# Patient Record
Sex: Female | Born: 1970
Health system: Southern US, Community
[De-identification: ages and names within clinical notes are randomized; demographics above are authoritative.]

## PROBLEM LIST (undated history)

## (undated) ENCOUNTER — Emergency Department (HOSPITAL_BASED_OUTPATIENT_CLINIC_OR_DEPARTMENT_OTHER): Admission: EM | Payer: 59 | Source: Home / Self Care

## (undated) DIAGNOSIS — N979 Female infertility, unspecified: Secondary | ICD-10-CM

## (undated) DIAGNOSIS — A64 Unspecified sexually transmitted disease: Secondary | ICD-10-CM

## (undated) DIAGNOSIS — N809 Endometriosis, unspecified: Secondary | ICD-10-CM

## (undated) DIAGNOSIS — F32A Depression, unspecified: Secondary | ICD-10-CM

## (undated) DIAGNOSIS — N939 Abnormal uterine and vaginal bleeding, unspecified: Secondary | ICD-10-CM

## (undated) DIAGNOSIS — G43909 Migraine, unspecified, not intractable, without status migrainosus: Secondary | ICD-10-CM

## (undated) DIAGNOSIS — D219 Benign neoplasm of connective and other soft tissue, unspecified: Secondary | ICD-10-CM

## (undated) DIAGNOSIS — F329 Major depressive disorder, single episode, unspecified: Secondary | ICD-10-CM

## (undated) HISTORY — DX: Female infertility, unspecified: N97.9

## (undated) HISTORY — DX: Unspecified sexually transmitted disease: A64

## (undated) HISTORY — DX: Migraine, unspecified, not intractable, without status migrainosus: G43.909

## (undated) HISTORY — PX: DILATION AND CURETTAGE OF UTERUS: SHX78

## (undated) HISTORY — DX: Endometriosis, unspecified: N80.9

## (undated) HISTORY — DX: Depression, unspecified: F32.A

## (undated) HISTORY — DX: Major depressive disorder, single episode, unspecified: F32.9

## (undated) HISTORY — DX: Benign neoplasm of connective and other soft tissue, unspecified: D21.9

## (undated) HISTORY — DX: Abnormal uterine and vaginal bleeding, unspecified: N93.9

---

## 2009-03-12 ENCOUNTER — Emergency Department (HOSPITAL_COMMUNITY): Admission: EM | Admit: 2009-03-12 | Discharge: 2009-03-12 | Payer: Self-pay | Admitting: Family Medicine

## 2009-06-19 ENCOUNTER — Emergency Department (HOSPITAL_COMMUNITY): Admission: EM | Admit: 2009-06-19 | Discharge: 2009-06-19 | Payer: Self-pay | Admitting: Emergency Medicine

## 2009-07-20 ENCOUNTER — Emergency Department (HOSPITAL_COMMUNITY): Admission: EM | Admit: 2009-07-20 | Discharge: 2009-07-21 | Payer: Self-pay | Admitting: Emergency Medicine

## 2010-02-18 ENCOUNTER — Emergency Department (HOSPITAL_COMMUNITY)
Admission: EM | Admit: 2010-02-18 | Discharge: 2010-02-18 | Payer: Self-pay | Source: Home / Self Care | Admitting: Emergency Medicine

## 2010-02-28 LAB — URINALYSIS, ROUTINE W REFLEX MICROSCOPIC
Bilirubin Urine: NEGATIVE
Hgb urine dipstick: NEGATIVE
Ketones, ur: NEGATIVE mg/dL
Nitrite: NEGATIVE
Protein, ur: NEGATIVE mg/dL
Specific Gravity, Urine: 1.024 (ref 1.005–1.030)
Urine Glucose, Fasting: NEGATIVE mg/dL
Urobilinogen, UA: 0.2 mg/dL (ref 0.0–1.0)
pH: 5 (ref 5.0–8.0)

## 2010-02-28 LAB — POCT PREGNANCY, URINE: Preg Test, Ur: NEGATIVE

## 2010-02-28 LAB — WET PREP, GENITAL
Trich, Wet Prep: NONE SEEN
Yeast Wet Prep HPF POC: NONE SEEN

## 2010-02-28 LAB — GC/CHLAMYDIA PROBE AMP, GENITAL
Chlamydia, DNA Probe: NEGATIVE
GC Probe Amp, Genital: NEGATIVE

## 2010-05-02 LAB — CBC
HCT: 36.4 % (ref 36.0–46.0)
Hemoglobin: 12.4 g/dL (ref 12.0–15.0)
MCHC: 34 g/dL (ref 30.0–36.0)
MCV: 91.2 fL (ref 78.0–100.0)
Platelets: 258 10*3/uL (ref 150–400)
RBC: 3.99 MIL/uL (ref 3.87–5.11)
RDW: 15.3 % (ref 11.5–15.5)
WBC: 6.8 10*3/uL (ref 4.0–10.5)

## 2010-05-02 LAB — COMPREHENSIVE METABOLIC PANEL
ALT: 17 U/L (ref 0–35)
AST: 19 U/L (ref 0–37)
Albumin: 3.7 g/dL (ref 3.5–5.2)
Alkaline Phosphatase: 61 U/L (ref 39–117)
BUN: 14 mg/dL (ref 6–23)
CO2: 25 mEq/L (ref 19–32)
Calcium: 8.8 mg/dL (ref 8.4–10.5)
Chloride: 107 mEq/L (ref 96–112)
Creatinine, Ser: 1.08 mg/dL (ref 0.4–1.2)
GFR calc Af Amer: >60 mL/min (ref >60)
GFR calc non Af Amer: 56 mL/min — ABNORMAL LOW (ref >60)
Glucose, Bld: 81 mg/dL (ref 70–99)
Potassium: 4.2 mEq/L (ref 3.5–5.1)
Sodium: 136 mEq/L (ref 135–145)
Total Bilirubin: 0.5 mg/dL (ref 0.3–1.2)
Total Protein: 6.8 g/dL (ref 6.0–8.3)

## 2010-05-02 LAB — DIFFERENTIAL
Basophils Absolute: 0.1 10*3/uL (ref 0.0–0.1)
Basophils Relative: 1 % (ref 0–1)
Eosinophils Absolute: 0.1 10*3/uL (ref 0.0–0.7)
Eosinophils Relative: 1 % (ref 0–5)
Lymphocytes Relative: 32 % (ref 12–46)
Lymphs Abs: 2.2 10*3/uL (ref 0.7–4.0)
Monocytes Absolute: 0.7 10*3/uL (ref 0.1–1.0)
Monocytes Relative: 11 % (ref 3–12)
Neutro Abs: 3.7 10*3/uL (ref 1.7–7.7)
Neutrophils Relative %: 55 % (ref 43–77)

## 2010-05-03 LAB — GC/CHLAMYDIA PROBE AMP, GENITAL
Chlamydia, DNA Probe: POSITIVE — AB
GC Probe Amp, Genital: NEGATIVE

## 2010-05-03 LAB — RPR: RPR Ser Ql: NONREACTIVE

## 2010-05-03 LAB — WET PREP, GENITAL
Clue Cells Wet Prep HPF POC: NONE SEEN
Yeast Wet Prep HPF POC: NONE SEEN

## 2013-07-02 ENCOUNTER — Encounter (HOSPITAL_COMMUNITY): Payer: Self-pay | Admitting: Emergency Medicine

## 2013-07-02 ENCOUNTER — Emergency Department (HOSPITAL_COMMUNITY)
Admission: EM | Admit: 2013-07-02 | Discharge: 2013-07-02 | Disposition: A | Discharge status: Home / Self Care | Payer: Self-pay | Attending: Emergency Medicine | Admitting: Emergency Medicine

## 2013-07-02 DIAGNOSIS — B349 Viral infection, unspecified: Secondary | ICD-10-CM

## 2013-07-02 DIAGNOSIS — B9789 Other viral agents as the cause of diseases classified elsewhere: Secondary | ICD-10-CM | POA: Insufficient documentation

## 2013-07-02 MED ORDER — ONDANSETRON 4 MG PO TBDP: 4.0000 mg | ORAL_TABLET | Freq: Three times a day (TID) | ORAL | Status: DC | PRN

## 2013-07-02 MED ORDER — SODIUM CHLORIDE 0.9 % IV BOLUS (SEPSIS)
1000.0000 mL | Freq: Once | INTRAVENOUS | Status: AC
  Administered 2013-07-02: 1000 mL via INTRAVENOUS

## 2013-07-02 MED ORDER — ONDANSETRON HCL 4 MG/2ML IJ SOLN
4.0000 mg | Freq: Once | INTRAMUSCULAR | Status: AC
  Administered 2013-07-02: 4 mg via INTRAVENOUS
  Filled 2013-07-02: qty 2

## 2013-07-02 MED ORDER — KETOROLAC TROMETHAMINE 30 MG/ML IJ SOLN
30.0000 mg | Freq: Once | INTRAMUSCULAR | Status: AC
  Administered 2013-07-02: 30 mg via INTRAVENOUS
  Filled 2013-07-02: qty 1

## 2013-07-02 NOTE — ED Notes (Signed)
Pt c/o n/v/d, fever/chills, sore throat. Symptoms started today while at work. 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 Everyone is sick". Pt states she took some Tylenol for her sore throat and fever. States her throat is still burning. Pt alert, no acute distress. Skin warm and dry.

## 2013-07-02 NOTE — ED Provider Notes (Signed)
CSN: [id number]     Arrival date & time 07/02/13  2117 History   This chart was scribed for non-physician practitioner Junius Creamer, NP, working with Delice Bison Ward, DO, by Neta Ehlers, ED Scribe. This patient was seen in room WTR7/WTR7 and the patient's care was started at 9:41 PM.  First MD Initiated Contact with Patient 07/02/13 2130     No chief complaint on file.   The history is provided by the patient. No language interpreter was used.   HPI Comments: Judy Rogers is a 43 y.o. female who presents to the Emergency Department complaining of emesis which began four hours ago and has been associated with a fever, chills, dizziness, fatigue, myalgia, nausea, and a sore throat. She has had approximately nine episodes of emesis. She denies dysuria. The pt has taken Tylenol with some relief. She has been exposed to sick contacts at work; she works at a nursing home.   No past medical history on file. No past surgical history on file. No family history on file. History  Substance Use Topics  . Smoking status: Not on file  . Smokeless tobacco: Not on file  . Alcohol Use: Not on file   OB History   No data available     Review of Systems  Constitutional: Positive for fever, chills, appetite change and fatigue.  HENT: Positive for sore throat.   Gastrointestinal: Positive for nausea and vomiting.  Genitourinary: Negative for dysuria.  Neurological: Positive for dizziness.  All other systems reviewed and are negative.   Allergies  Review of patient's allergies indicates not on file.  Home Medications   Prior to Admission medications   Not on File   Triage Vitals: BP 142/64  Pulse 64  Temp(Src) 98.3 F (36.8 C) (Oral)  Resp 20  SpO2 100%  Physical Exam  Nursing note and vitals reviewed. Constitutional: She is oriented to person, place, and time. She appears well-developed and well-nourished. No distress.  HENT:  Head: Normocephalic and atraumatic.  Mouth/Throat:  Oropharynx is clear and moist. No oropharyngeal exudate.  Eyes: EOM are normal.  Neck: Neck supple. No tracheal deviation present.  Cardiovascular: Normal rate.   Pulmonary/Chest: Effort normal. No respiratory distress.  Musculoskeletal: Normal range of motion.  Neurological: She is alert and oriented to person, place, and time.  Skin: Skin is warm and dry.  Psychiatric: She has a normal mood and affect. Her behavior is normal.    ED Course  Procedures (including critical care time)  DIAGNOSTIC STUDIES: Oxygen Saturation is 100% on room air, normal by my interpretation.    COORDINATION OF CARE:  9:45 PM- Discussed treatment plan with patient, and the patient agreed to the plan. The plan includes anti-nausea medication and fluids.   Labs Review Labs Reviewed - No data to display  Imaging Review No results found.   EKG Interpretation None      MDM   Final diagnoses:  None      I personally performed the services described in this documentation, which was scribed in my presence. The recorded information has been reviewed and is accurate.     Garald Balding, NP 07/02/13 [phone]

## 2013-07-02 NOTE — ED Notes (Signed)
Removed pt IV to discharge home per RN, Domingo Dimes.

## 2013-07-03 NOTE — ED Provider Notes (Signed)
Medical screening examination/treatment/procedure(s) were performed by non-physician practitioner and as supervising physician I was immediately available for consultation/collaboration.   EKG Interpretation None        [place], DO 07/03/13 1503

## 2013-12-02 ENCOUNTER — Encounter (HOSPITAL_COMMUNITY): Payer: Self-pay | Admitting: Emergency Medicine

## 2013-12-02 ENCOUNTER — Emergency Department (HOSPITAL_COMMUNITY)
Admission: EM | Admit: 2013-12-02 | Discharge: 2013-12-02 | Disposition: A | Discharge status: Home / Self Care | Payer: Self-pay | Attending: Emergency Medicine | Admitting: Emergency Medicine

## 2013-12-02 DIAGNOSIS — Z3202 Encounter for pregnancy test, result negative: Secondary | ICD-10-CM | POA: Insufficient documentation

## 2013-12-02 DIAGNOSIS — N390 Urinary tract infection, site not specified: Secondary | ICD-10-CM | POA: Insufficient documentation

## 2013-12-02 LAB — WET PREP, GENITAL
Clue Cells Wet Prep HPF POC: NONE SEEN
Trich, Wet Prep: NONE SEEN
Yeast Wet Prep HPF POC: NONE SEEN

## 2013-12-02 LAB — URINALYSIS, ROUTINE W REFLEX MICROSCOPIC
Bilirubin Urine: NEGATIVE
Glucose, UA: NEGATIVE mg/dL
Hgb urine dipstick: NEGATIVE
Ketones, ur: 15 mg/dL — AB
Nitrite: NEGATIVE
Protein, ur: NEGATIVE mg/dL
Specific Gravity, Urine: 1.031 — ABNORMAL HIGH (ref 1.005–1.030)
Urobilinogen, UA: 0.2 mg/dL (ref 0.0–1.0)
pH: 6 (ref 5.0–8.0)

## 2013-12-02 LAB — URINE MICROSCOPIC-ADD ON

## 2013-12-02 LAB — PREGNANCY, URINE: Preg Test, Ur: NEGATIVE

## 2013-12-02 MED ORDER — PHENAZOPYRIDINE HCL 100 MG PO TABS
200.0000 mg | ORAL_TABLET | Freq: Once | ORAL | Status: AC
  Administered 2013-12-02: 200 mg via ORAL
  Filled 2013-12-02: qty 2

## 2013-12-02 MED ORDER — NAPROXEN 500 MG PO TABS: 500.0000 mg | ORAL_TABLET | Freq: Two times a day (BID) | ORAL | Status: DC

## 2013-12-02 MED ORDER — PHENAZOPYRIDINE HCL 200 MG PO TABS: 200.0000 mg | ORAL_TABLET | Freq: Three times a day (TID) | ORAL | Status: DC

## 2013-12-02 MED ORDER — CEPHALEXIN 500 MG PO CAPS: 500.0000 mg | ORAL_CAPSULE | Freq: Two times a day (BID) | ORAL | Status: DC

## 2013-12-02 MED ORDER — NAPROXEN 250 MG PO TABS
500.0000 mg | ORAL_TABLET | Freq: Once | ORAL | Status: AC
  Administered 2013-12-02: 500 mg via ORAL
  Filled 2013-12-02: qty 2

## 2013-12-02 NOTE — ED Notes (Signed)
PA at bedside for pelvic exam.

## 2013-12-02 NOTE — ED Notes (Signed)
PA at bedside.

## 2013-12-02 NOTE — ED Provider Notes (Signed)
Medical screening examination/treatment/procedure(s) were performed by non-physician practitioner and as supervising physician I was immediately available for consultation/collaboration.   EKG Interpretation None       Varney Biles, MD 12/02/13 1807

## 2013-12-02 NOTE — Discharge Planning (Signed)
[facility] Specialist  Spoke to patient regarding primary care resources and establishing care with a provider. Resource guide and my contact information provided for any future questions or concerns. No other [facility] Specialist needs identified at this time.

## 2013-12-02 NOTE — Discharge Instructions (Signed)
Please follow up with your primary care physician in 1-2 days. If you do not have one please call the [facility] number listed above. Please follow up with an Ob/Gyn at [facility] [facility] or with your own Ob/Gyn to schedule a follow up appointment. Please take your antibiotic until completion. Someone from the hospital will call you for any positive gonorrhea or chlamydia test, if it were to be positive you would have to return to your primary care physician, OB/GYN, or emergency department for treatment. Please read all discharge instructions and return precautions.   Urinary Tract Infection Urinary tract infections (UTIs) can develop anywhere along your urinary tract. Your urinary tract is your body's drainage system for removing wastes and extra water. Your urinary tract includes two kidneys, two ureters, a bladder, and a urethra. Your kidneys are a pair of bean-shaped organs. Each kidney is about the size of your fist. They are located below your ribs, one on each side of your spine. CAUSES Infections are caused by microbes, which are microscopic organisms, including fungi, viruses, and bacteria. These organisms are so small that they can only be seen through a microscope. Bacteria are the microbes that most commonly cause UTIs. SYMPTOMS  Symptoms of UTIs may vary by age and gender of the patient and by the location of the infection. Symptoms in young women typically include a frequent and intense urge to urinate and a painful, burning feeling in the bladder or urethra during urination. Older women and men are more likely to be tired, shaky, and weak and have muscle aches and abdominal pain. A fever may mean the infection is in your kidneys. Other symptoms of a kidney infection include pain in your back or sides below the ribs, nausea, and vomiting. DIAGNOSIS To diagnose a UTI, your caregiver will ask you about your symptoms. Your caregiver also will ask to provide a  urine sample. The urine sample will be tested for bacteria and white blood cells. White blood cells are made by your body to help fight infection. TREATMENT  Typically, UTIs can be treated with medication. Because most UTIs are caused by a bacterial infection, they usually can be treated with the use of antibiotics. The choice of antibiotic and length of treatment depend on your symptoms and the type of bacteria causing your infection. HOME CARE INSTRUCTIONS  If you were prescribed antibiotics, take them exactly as your caregiver instructs you. Finish the medication even if you feel better after you have only taken some of the medication.  Drink enough water and fluids to keep your urine clear or pale yellow.  Avoid caffeine, tea, and carbonated beverages. They tend to irritate your bladder.  Empty your bladder often. Avoid holding urine for long periods of time.  Empty your bladder before and after sexual intercourse.  After a bowel movement, women should cleanse from front to back. Use each tissue only once. SEEK MEDICAL CARE IF:   You have back pain.  You develop a fever.  Your symptoms do not begin to resolve within 3 days. SEEK IMMEDIATE MEDICAL CARE IF:   You have severe back pain or lower abdominal pain.  You develop chills.  You have nausea or vomiting.  You have continued burning or discomfort with urination. MAKE SURE YOU:   Understand these instructions.  Will watch your condition.  Will get help right away if you are not doing well or get worse. Document Released: 11/09/2004 Document Revised: 08/01/2011 Document Reviewed: 03/10/2011  Exit[employer] Patient Information 2015 [place]. This information is not intended to replace advice given to you by your health care provider. Make sure you discuss any questions you have with your health care provider.

## 2013-12-02 NOTE — ED Provider Notes (Signed)
CSN: [id number]     Arrival date & time 12/02/13  0717 History   First MD Initiated Contact with Patient 12/02/13 [phone]     Chief Complaint  Patient presents with  . Abdominal Cramping  . Back Pain     (Consider location/radiation/quality/duration/timing/severity/associated sxs/prior Treatment) HPI Comments: Patient is a 43 year old female presenting to the emergency department for 2 weeks of lower abdominal cramping pain with radiation to back with associated 1 week clear vaginal discharge and concentrated urine with urinary frequency. Alleviating factors: laying down. Aggravating factors: none. Medications tried prior to arrival: none. Denies any fevers, chills, nausea, vomiting, constipation, diarrhea, vaginal bleeding. No history of pregnancies. No abdominal surgical history.    Patient is a 43 y.o. female presenting with cramps and back pain.  Abdominal Cramping Associated symptoms: vaginal discharge   Back Pain Associated symptoms: abdominal pain     History reviewed. No pertinent past medical history. History reviewed. No pertinent past surgical history. No family history on file. History  Substance Use Topics  . Smoking status: Never Smoker   . Smokeless tobacco: Not on file  . Alcohol Use: No   OB History   Grav Para Term Preterm Abortions TAB SAB Ect Mult Living                 Review of Systems  Gastrointestinal: Positive for abdominal pain.  Genitourinary: Positive for frequency and vaginal discharge.  Musculoskeletal: Positive for back pain.  All other systems reviewed and are negative.     Allergies  Review of patient's allergies indicates no known allergies.  Home Medications   Prior to Admission medications   Medication Sig Start Date End Date Taking? Authorizing Provider  acetaminophen (TYLENOL) 500 MG tablet Take 1,000 mg by mouth every 6 (six) hours as needed for mild pain.   Yes Historical Provider, MD  cephALEXin (KEFLEX) 500 MG capsule Take 1  capsule (500 mg total) by mouth 2 (two) times daily. 12/02/13   Krystofer Hevener L Leocadia Idleman, PA-C  naproxen (NAPROSYN) 500 MG tablet Take 1 tablet (500 mg total) by mouth 2 (two) times daily with a meal. 12/02/13   Kentrail Shew L Adien Kimmel, PA-C  phenazopyridine (PYRIDIUM) 200 MG tablet Take 1 tablet (200 mg total) by mouth 3 (three) times daily. 12/02/13   Kellis Mcadam L Kelicia Youtz, PA-C   BP 131/69  Pulse 74  Temp(Src) 97.5 F (36.4 C) (Oral)  Resp 18  Ht 5\' 5"  (1.651 m)  Wt 185 lb (83.915 kg)  BMI 30.79 kg/m2  SpO2 100%  LMP 11/25/2013 Physical Exam  Nursing note and vitals reviewed. Constitutional: She is oriented to person, place, and time. She appears well-developed and well-nourished. No distress.  HENT:  Head: Normocephalic and atraumatic.  Right Ear: External ear normal.  Left Ear: External ear normal.  Nose: Nose normal.  Mouth/Throat: Oropharynx is clear and moist.  Eyes: Conjunctivae are normal.  Neck: Normal range of motion. Neck supple.  Cardiovascular: Normal rate, regular rhythm and normal heart sounds.   Pulmonary/Chest: Effort normal and breath sounds normal.  Abdominal: Soft. Bowel sounds are normal. There is no tenderness.  Musculoskeletal: Normal range of motion.  Neurological: She is alert and oriented to person, place, and time.  Skin: Skin is warm and dry. She is not diaphoretic.  Psychiatric: She has a normal mood and affect.   Exam performed by Baron Sane L,  exam chaperoned Date: 12/02/2013 Pelvic exam: normal external genitalia without evidence of trauma. VULVA: normal appearing vulva with no masses,  tenderness or lesion. VAGINA: normal appearing vagina with normal color and discharge, no lesions. CERVIX: normal appearing cervix without lesions, cervical motion tenderness absent, cervical os closed with out purulent discharge; vaginal discharge - white, Wet prep and DNA probe for chlamydia and GC obtained.   ADNEXA: normal adnexa in size, nontender  and no masses UTERUS: uterus is normal size, shape, consistency and nontender.    ED Course  Procedures (including critical care time) Medications  phenazopyridine (PYRIDIUM) tablet 200 mg (200 mg Oral Given 12/02/13 0838)  naproxen (NAPROSYN) tablet 500 mg (500 mg Oral Given 12/02/13 0838)    Labs Review Labs Reviewed  WET PREP, GENITAL - Abnormal; Notable for the following:    WBC, Wet Prep HPF POC FEW (*)    All other components within normal limits  URINALYSIS, ROUTINE W REFLEX MICROSCOPIC - Abnormal; Notable for the following:    Color, Urine AMBER (*)    Specific Gravity, Urine 1.031 (*)    Ketones, ur 15 (*)    Leukocytes, UA TRACE (*)    All other components within normal limits  URINE MICROSCOPIC-ADD ON - Abnormal; Notable for the following:    Squamous Epithelial / LPF FEW (*)    Bacteria, UA FEW (*)    All other components within normal limits  GC/CHLAMYDIA PROBE AMP  PREGNANCY, URINE    Imaging Review No results found.   EKG Interpretation None      MDM   Final diagnoses:  UTI (lower urinary tract infection)    Filed Vitals:   12/02/13 0815  BP: 131/69  Pulse: 74  Temp:   Resp:    Afebrile, NAD, non-toxic appearing, AAOx4. Abdomen soft, non-tender, non-distended. No CVA tenderness. Pelvic exam with CMT or adnexal fullness/tenerness.   1) UTI: Pt has been diagnosed with a UTI. Pt is afebrile, no CVA tenderness, normotensive, and denies N/V. Pt to be dc home with antibiotics and instructions to follow up with PCP if symptoms persist.  2) Vaginal discharge: GC/Chlamydia sent. No CMT or adnexal fullness. White discharge noted. Few WBCs, low risk patient for STDs, will wait for GC/Chlamydia for treatment, discussed this with patient who is agreeable to plan.   Advised PCP f/u. Advised Ob/Gyn f/u. Return precautions discussed. Patient is agreeable to plan. Patient is stable at time of discharge       Harlow Mares, PA-C 12/02/13 1506

## 2013-12-02 NOTE — ED Notes (Signed)
Pt reports lower back pain and lower abdominal cramping x 1 week with slightly abnormal vaginal discharge. Denies bleeding, nvd.

## 2013-12-03 LAB — GC/CHLAMYDIA PROBE AMP
CT Probe RNA: NEGATIVE
GC Probe RNA: NEGATIVE

## 2015-10-30 ENCOUNTER — Emergency Department (HOSPITAL_COMMUNITY)
Admission: EM | Admit: 2015-10-30 | Discharge: 2015-10-30 | Disposition: A | Discharge status: Home / Self Care | Payer: Self-pay | Attending: Emergency Medicine | Admitting: Emergency Medicine

## 2015-10-30 ENCOUNTER — Encounter (HOSPITAL_COMMUNITY): Payer: Self-pay | Admitting: Emergency Medicine

## 2015-10-30 ENCOUNTER — Emergency Department (HOSPITAL_COMMUNITY): Payer: Self-pay

## 2015-10-30 DIAGNOSIS — Z79899 Other long term (current) drug therapy: Secondary | ICD-10-CM | POA: Insufficient documentation

## 2015-10-30 DIAGNOSIS — R102 Pelvic and perineal pain: Secondary | ICD-10-CM | POA: Insufficient documentation

## 2015-10-30 DIAGNOSIS — O039 Complete or unspecified spontaneous abortion without complication: Secondary | ICD-10-CM | POA: Insufficient documentation

## 2015-10-30 LAB — CBC WITH DIFFERENTIAL/PLATELET
Basophils Absolute: 0 10*3/uL (ref 0.0–0.1)
Basophils Relative: 0 %
EOS ABS: 0.1 10*3/uL (ref 0.0–0.7)
Eosinophils Relative: 1 %
HEMATOCRIT: 39.9 % (ref 36.0–46.0)
HEMOGLOBIN: 13.2 g/dL (ref 12.0–15.0)
LYMPHS ABS: 1.6 10*3/uL (ref 0.7–4.0)
Lymphocytes Relative: 19 %
MCH: 29.8 pg (ref 26.0–34.0)
MCHC: 33.1 g/dL (ref 30.0–36.0)
MCV: 90.1 fL (ref 78.0–100.0)
Monocytes Absolute: 0.7 10*3/uL (ref 0.1–1.0)
Monocytes Relative: 7 %
Neutro Abs: 6.5 10*3/uL (ref 1.7–7.7)
Neutrophils Relative %: 73 %
Platelets: 289 10*3/uL (ref 150–400)
RBC: 4.43 MIL/uL (ref 3.87–5.11)
RDW: 14.1 % (ref 11.5–15.5)
WBC: 8.9 10*3/uL (ref 4.0–10.5)

## 2015-10-30 LAB — COMPREHENSIVE METABOLIC PANEL
ALBUMIN: 4.1 g/dL (ref 3.5–5.0)
ALK PHOS: 48 U/L (ref 38–126)
ALT: 13 U/L — AB (ref 14–54)
AST: 16 U/L (ref 15–41)
Anion gap: 9 (ref 5–15)
BUN: 10 mg/dL (ref 6–20)
CALCIUM: 9.3 mg/dL (ref 8.9–10.3)
CO2: 24 mmol/L (ref 22–32)
CREATININE: 0.78 mg/dL (ref 0.44–1.00)
Chloride: 105 mmol/L (ref 101–111)
GFR calc non Af Amer: >60 mL/min (ref >60)
GLUCOSE: 95 mg/dL (ref 65–99)
Potassium: 3.9 mmol/L (ref 3.5–5.1)
SODIUM: 138 mmol/L (ref 135–145)
Total Bilirubin: 0.6 mg/dL (ref 0.3–1.2)
Total Protein: 7.8 g/dL (ref 6.5–8.1)

## 2015-10-30 LAB — ABO/RH: ABO/RH(D): O POS

## 2015-10-30 LAB — LIPASE, BLOOD: Lipase: 36 U/L (ref 11–51)

## 2015-10-30 LAB — I-STAT BETA HCG BLOOD, ED (MC, WL, AP ONLY): I-stat hCG, quantitative: >2000 m[IU]/mL — ABNORMAL HIGH (ref <5)

## 2015-10-30 LAB — HCG, QUANTITATIVE, PREGNANCY: hCG, Beta Chain, Quant, S: 9402 m[IU]/mL — ABNORMAL HIGH (ref <5)

## 2015-10-30 MED ORDER — SODIUM CHLORIDE 0.9 % IV BOLUS (SEPSIS)
1000.0000 mL | Freq: Once | INTRAVENOUS | Status: AC
  Administered 2015-10-30: 1000 mL via INTRAVENOUS

## 2015-10-30 MED ORDER — HYDROCODONE-ACETAMINOPHEN 5-325 MG PO TABS: 1.0000 | ORAL_TABLET | ORAL | 0 refills | Status: DC | PRN

## 2015-10-30 MED ORDER — ONDANSETRON 4 MG PO TBDP: 4.0000 mg | ORAL_TABLET | ORAL | 0 refills | Status: DC | PRN

## 2015-10-30 MED ORDER — IBUPROFEN 600 MG PO TABS: 600.0000 mg | ORAL_TABLET | Freq: Four times a day (QID) | ORAL | 0 refills | Status: DC | PRN

## 2015-10-30 MED ORDER — ONDANSETRON HCL 4 MG/2ML IJ SOLN
4.0000 mg | Freq: Once | INTRAMUSCULAR | Status: AC
  Administered 2015-10-30: 4 mg via INTRAVENOUS
  Filled 2015-10-30: qty 2

## 2015-10-30 MED ORDER — HYDROMORPHONE HCL 1 MG/ML IJ SOLN
1.0000 mg | Freq: Once | INTRAMUSCULAR | Status: AC
  Administered 2015-10-30: 1 mg via INTRAVENOUS
  Filled 2015-10-30: qty 1

## 2015-10-30 NOTE — ED Notes (Signed)
Pt is aware of Urinalysis.  

## 2015-10-30 NOTE — ED Triage Notes (Signed)
Per pt, states she has been cramping for pas 3-4 days-went to bathroom this am and passed large clot followed by smaller ones-states increased abdominal cramping-states she missed period last month-does not know if she was pregnant

## 2015-10-30 NOTE — ED Provider Notes (Signed)
[facility] DEPT Provider Note   CSN: [id number] Arrival date & time: 10/30/15  1255     History   Chief Complaint Chief Complaint  Patient presents with  . Abdominal Pain  . Vaginal Bleeding    HPI Judy Rogers is a 45 y.o. female who presents with pelvic cramping and vaginal bleeding. No significant PMH. Her LMP was in early July. She states she did not think she could get pregnant because of her age and the fact she has been on the Depo-provera shot since she was 53. She recently stopped Depo 6 months ago. She had an acute onset of lower pelvic cramping starting today. The pain is constant, and cramping. She went to the bathroom and passed large "egg sized" clots. She continued to bleed and was urged by her sister to come to the ED for further evaluation. She reports associated nausea. Denies fever, chills, chest pain, SOB, upper abdominal pain, vomiting, diarrhea, irritative voiding symptoms.    HPI  History reviewed. No pertinent past medical history.  There are no active problems to display for this patient.   History reviewed. No pertinent surgical history.  OB History    No data available       Home Medications    Prior to Admission medications   Medication Sig Start Date End Date Taking? Authorizing Provider  acetaminophen (TYLENOL) 500 MG tablet Take 1,000 mg by mouth every 6 (six) hours as needed for mild pain.    Historical Provider, MD  cephALEXin (KEFLEX) 500 MG capsule Take 1 capsule (500 mg total) by mouth 2 (two) times daily. 12/02/13   Jennifer Piepenbrink, PA-C  naproxen (NAPROSYN) 500 MG tablet Take 1 tablet (500 mg total) by mouth 2 (two) times daily with a meal. 12/02/13   Jennifer Piepenbrink, PA-C  phenazopyridine (PYRIDIUM) 200 MG tablet Take 1 tablet (200 mg total) by mouth 3 (three) times daily. 12/02/13   Baron Sane, PA-C    Family History No family history on file.  Social History Social History  Substance Use Topics  .  Smoking status: Never Smoker  . Smokeless tobacco: Never Used  . Alcohol use No     Allergies   Review of patient's allergies indicates no known allergies.   Review of Systems Review of Systems  Constitutional: Negative for chills and fever.  Respiratory: Negative for shortness of breath.   Cardiovascular: Negative for chest pain.  Gastrointestinal: Positive for nausea. Negative for abdominal pain, constipation, diarrhea and vomiting.  Genitourinary: Positive for vaginal bleeding. Negative for dysuria, flank pain and vaginal discharge.  All other systems reviewed and are negative.    Physical Exam Updated Vital Signs BP 138/75 (BP Location: Left Arm)   Pulse 69   Temp 98 F (36.7 C) (Oral)   Resp 15   LMP 08/14/2015 (Approximate)   SpO2 100%   Physical Exam  Constitutional: She is oriented to person, place, and time. She appears well-developed and well-nourished. No distress.  HENT:  Head: Normocephalic and atraumatic.  Eyes: Conjunctivae are normal. Pupils are equal, round, and reactive to light. Right eye exhibits no discharge. Left eye exhibits no discharge. No scleral icterus.  Neck: Normal range of motion. Neck supple.  Cardiovascular: Normal rate and regular rhythm.  Exam reveals no gallop and no friction rub.   No murmur heard. Pulmonary/Chest: Effort normal and breath sounds normal. No respiratory distress. She has no wheezes. She has no rales. She exhibits no tenderness.  Abdominal: Soft. Bowel sounds are normal. She  exhibits no distension and no mass. There is tenderness. There is no rebound and no guarding. No hernia.  Bilateral pelvic pain  Genitourinary:  Genitourinary Comments: No inguinal lymphadenopathy or inguinal hernia noted. Normal external genitalia. No pain with speculum insertion. Closed cervical os with normal appearance - no rash or lesions. Large amount of blood in vaginal vault with clots which were removed with ring forceps and suction. On  bimanual examination no adnexal tenderness or cervical motion tenderness. Chaperone present during exam.    Musculoskeletal: She exhibits no edema.  Neurological: She is alert and oriented to person, place, and time.  Skin: Skin is warm and dry.  Psychiatric: She has a normal mood and affect. Her behavior is normal.  Nursing note and vitals reviewed.    ED Treatments / Results  Labs (all labs ordered are listed, but only abnormal results are displayed) Labs Reviewed  COMPREHENSIVE METABOLIC PANEL - Abnormal; Notable for the following:       Result Value   ALT 13 (*)    All other components within normal limits  HCG, QUANTITATIVE, PREGNANCY - Abnormal; Notable for the following:    hCG, Beta Chain, Quant, S 9,402 (*)    All other components within normal limits  I-STAT BETA HCG BLOOD, ED ([facility], [facility], [facility] ONLY) - Abnormal; Notable for the following:    I-stat hCG, quantitative >2,000.0 (*)    All other components within normal limits  CBC WITH DIFFERENTIAL/PLATELET  LIPASE, BLOOD  ABO/RH  GC/CHLAMYDIA PROBE AMP ([facility]) NOT AT [facility]    EKG  EKG Interpretation None       Radiology US Ob Comp Less 14 Wks  Result Date: 10/30/2015 CLINICAL DATA:  45 year old pregnant female with pelvic pain and bleeding for 1 day. Beta HCG greater than 2000. EXAM: OBSTETRIC <14 WK [place] AND TRANSVAGINAL OB US TECHNIQUE: Both transabdominal and transvaginal ultrasound examinations were performed for complete evaluation of the gestation as well as the maternal uterus, adnexal regions, and pelvic cul-de-sac. Transvaginal technique was performed to assess early pregnancy. COMPARISON:  None. FINDINGS: Intrauterine gestational sac: Not visualized Yolk sac:  Not visualized Embryo:  Not visualized Maternal uterus/adnexae: Multiple uterine fibroids are identified. The largest identifiable fibroid is intramural and measures 3 cm in the anterior uterine body. The endometrium measures 13 mm in diameter. The right  ovary measures 3.1 x 2 x 2.2 cm. The left ovary measures 3.6 x 2 x 2.5 cm. There is an ill-defined hypoechoic area measuring up to 5.8 cm adjacent to the right uterine fundus which most likely represents a pedunculated fibroid but this area is difficult to evaluate due to patient body habitus and position. No free fluid identified. IMPRESSION: No intrauterine gestational sac, yolk sac, fetal pole, or cardiac activity visualized. Differential considerations include intrauterine gestation too early to be sonographically visualized, spontaneous abortion, or ectopic pregnancy. Consider follow-up ultrasound in 10 days and serial quantitative beta HCG follow-up. Multiple uterine fibroids. Ill-defined hypoechoic area measuring up to 5.8 cm adjacent to the right uterine fundus -possibly a pedunculated fibroid but difficult to evaluate due to patient body habitus and position. No free fluid. Electronically Signed   By: Margarette Canada M.D.   On: 10/30/2015 14:57   US Ob Transvaginal  Result Date: 10/30/2015 CLINICAL DATA:  45 year old pregnant female with pelvic pain and bleeding for 1 day. Beta HCG greater than 2000. EXAM: OBSTETRIC <14 WK [place] AND TRANSVAGINAL OB US TECHNIQUE: Both transabdominal and transvaginal ultrasound examinations were performed for complete  evaluation of the gestation as well as the maternal uterus, adnexal regions, and pelvic cul-de-sac. Transvaginal technique was performed to assess early pregnancy. COMPARISON:  None. FINDINGS: Intrauterine gestational sac: Not visualized Yolk sac:  Not visualized Embryo:  Not visualized Maternal uterus/adnexae: Multiple uterine fibroids are identified. The largest identifiable fibroid is intramural and measures 3 cm in the anterior uterine body. The endometrium measures 13 mm in diameter. The right ovary measures 3.1 x 2 x 2.2 cm. The left ovary measures 3.6 x 2 x 2.5 cm. There is an ill-defined hypoechoic area measuring up to 5.8 cm adjacent to the right uterine  fundus which most likely represents a pedunculated fibroid but this area is difficult to evaluate due to patient body habitus and position. No free fluid identified. IMPRESSION: No intrauterine gestational sac, yolk sac, fetal pole, or cardiac activity visualized. Differential considerations include intrauterine gestation too early to be sonographically visualized, spontaneous abortion, or ectopic pregnancy. Consider follow-up ultrasound in 10 days and serial quantitative beta HCG follow-up. Multiple uterine fibroids. Ill-defined hypoechoic area measuring up to 5.8 cm adjacent to the right uterine fundus -possibly a pedunculated fibroid but difficult to evaluate due to patient body habitus and position. No free fluid. Electronically Signed   By: Margarette Canada M.D.   On: 10/30/2015 14:57    Procedures Procedures (including critical care time)  Medications Ordered in ED Medications  HYDROmorphone (DILAUDID) injection 1 mg (1 mg Intravenous Given 10/30/15 1456)  ondansetron (ZOFRAN) injection 4 mg (4 mg Intravenous Given 10/30/15 1456)  sodium chloride 0.9 % bolus 1,000 mL (1,000 mLs Intravenous New Bag/Given 10/30/15 1528)     Initial Impression / Assessment and Plan / ED Course  I have reviewed the triage vital signs and the nursing notes.  Pertinent labs & imaging results that were available during my care of the patient were reviewed by me and considered in my medical decision making (see chart for details).  Clinical Course   45 year old female presents with spontaneous abortion. Patient is afebrile, not tachycardic or tachypneic, normotensive, and not hypoxic. CBC is unremarkable. CMP unremarkable. Formal hcg quant is 9,402. Pelvic and TVUS do not show IUP or EP but do show multiple uterine fibroids. She is Rh +. Consult to OBGYN placed to obtain close follow up. Signed patient out to A Meyer PA-C at shift change pending callback from OBGYN. Anticipate d/c with pain medicine and antiemetic.    Final Clinical Impressions(s) / ED Diagnoses   Final diagnoses:  Spontaneous abortion    New Prescriptions New Prescriptions   No medications on file     Recardo Evangelist, PA-C 10/30/15 [place], MD 10/30/15 1729

## 2015-11-08 ENCOUNTER — Other Ambulatory Visit: Payer: Self-pay

## 2016-05-03 ENCOUNTER — Encounter: Payer: Self-pay | Admitting: Obstetrics and Gynecology

## 2016-05-03 ENCOUNTER — Ambulatory Visit (INDEPENDENT_AMBULATORY_CARE_PROVIDER_SITE_OTHER): Payer: BLUE CROSS/BLUE SHIELD | Admitting: Obstetrics and Gynecology

## 2016-05-03 VITALS — BP 150/98 | HR 72 | Resp 16 | Ht 65.5 in | Wt 192.0 lb

## 2016-05-03 DIAGNOSIS — N898 Other specified noninflammatory disorders of vagina: Secondary | ICD-10-CM

## 2016-05-03 DIAGNOSIS — F418 Other specified anxiety disorders: Secondary | ICD-10-CM | POA: Diagnosis not present

## 2016-05-03 DIAGNOSIS — Z0000 Encounter for general adult medical examination without abnormal findings: Secondary | ICD-10-CM

## 2016-05-03 DIAGNOSIS — N946 Dysmenorrhea, unspecified: Secondary | ICD-10-CM | POA: Diagnosis not present

## 2016-05-03 DIAGNOSIS — Z01419 Encounter for gynecological examination (general) (routine) without abnormal findings: Secondary | ICD-10-CM

## 2016-05-03 DIAGNOSIS — D259 Leiomyoma of uterus, unspecified: Secondary | ICD-10-CM

## 2016-05-03 DIAGNOSIS — N979 Female infertility, unspecified: Secondary | ICD-10-CM

## 2016-05-03 DIAGNOSIS — I10 Essential (primary) hypertension: Secondary | ICD-10-CM | POA: Diagnosis not present

## 2016-05-03 DIAGNOSIS — E663 Overweight: Secondary | ICD-10-CM | POA: Diagnosis not present

## 2016-05-03 DIAGNOSIS — Z833 Family history of diabetes mellitus: Secondary | ICD-10-CM | POA: Diagnosis not present

## 2016-05-03 DIAGNOSIS — N920 Excessive and frequent menstruation with regular cycle: Secondary | ICD-10-CM

## 2016-05-03 DIAGNOSIS — Z124 Encounter for screening for malignant neoplasm of cervix: Secondary | ICD-10-CM | POA: Diagnosis not present

## 2016-05-03 LAB — COMPREHENSIVE METABOLIC PANEL
ALK PHOS: 72 U/L (ref 33–115)
ALT: 14 U/L (ref 6–29)
AST: 18 U/L (ref 10–35)
Albumin: 4.5 g/dL (ref 3.6–5.1)
BUN: 20 mg/dL (ref 7–25)
CALCIUM: 9.7 mg/dL (ref 8.6–10.2)
CHLORIDE: 103 mmol/L (ref 98–110)
CO2: 25 mmol/L (ref 20–31)
Creat: 1.03 mg/dL (ref 0.50–1.10)
GLUCOSE: 82 mg/dL (ref 65–99)
POTASSIUM: 4.1 mmol/L (ref 3.5–5.3)
Sodium: 139 mmol/L (ref 135–146)
Total Bilirubin: 0.5 mg/dL (ref 0.2–1.2)
Total Protein: 7.7 g/dL (ref 6.1–8.1)

## 2016-05-03 LAB — LIPID PANEL
CHOLESTEROL: 208 mg/dL — AB (ref <200)
HDL: 70 mg/dL (ref >50)
LDL Cholesterol: 123 mg/dL — ABNORMAL HIGH (ref <100)
TRIGLYCERIDES: 76 mg/dL (ref <150)
Total CHOL/HDL Ratio: 3 Ratio (ref <5.0)
VLDL: 15 mg/dL (ref <30)

## 2016-05-03 LAB — CBC
HEMATOCRIT: 41.8 % (ref 35.0–45.0)
Hemoglobin: 13.2 g/dL (ref 11.7–15.5)
MCH: 28.6 pg (ref 27.0–33.0)
MCHC: 31.6 g/dL — ABNORMAL LOW (ref 32.0–36.0)
MCV: 90.5 fL (ref 80.0–100.0)
MPV: 11.6 fL (ref 7.5–12.5)
Platelets: 339 10*3/uL (ref 140–400)
RBC: 4.62 MIL/uL (ref 3.80–5.10)
RDW: 14.9 % (ref 11.0–15.0)
WBC: 9.3 10*3/uL (ref 3.8–10.8)

## 2016-05-03 LAB — FERRITIN: Ferritin: 32 ng/mL (ref 10–232)

## 2016-05-03 LAB — TSH: TSH: 0.73 mIU/L

## 2016-05-03 MED ORDER — NAPROXEN SODIUM 550 MG PO TABS: 550.0000 mg | ORAL_TABLET | Freq: Two times a day (BID) | ORAL | 2 refills | Status: DC

## 2016-05-03 MED ORDER — CITALOPRAM HYDROBROMIDE 20 MG PO TABS: ORAL_TABLET | ORAL | 1 refills | Status: DC

## 2016-05-03 NOTE — Progress Notes (Signed)
46 y.o. G1P0010 SingleAfrican AmericanF here for annual exam.   She has a long h/o abnormal uterine bleeding. States she has never been on contraception. She has gone up to a year without a cycle in the past. Over the last year, cycles q month. Very heavy for a couple of days she can saturate a super + tampon in 15-30 minutes for 2-3 days. Passes large clots, bad cramps. Cycles have always been this heavy. Getting longer. Used to last for 3-4 days, now 7-10 days. NSAID's and tylenol don't really help her pain.  She was told she had PCOS in the past. In the fall she had an SAB, was very traumatic for her. She always wanted kids. At the time of the SAB she had an ultrasound that showed a fibroid uterus and a ? Of a pedunculated fibroid in the right fundal region Sexually active, same partner her entire life (her only partner). She is teary when discussing her long h/o infertility and her SAB. Thinks she does feel depressed, not every day. Some anxiety.   Period Duration (Days): 7-10 days - heavy clots  Period Pattern: (!) Irregular Menstrual Flow: Heavy Menstrual Control: Maxi pad, Tampon Menstrual Control Change Freq (Hours): changes pad and tampon every 30 mins  Dysmenorrhea: (!) Severe Dysmenorrhea Symptoms: Cramping  Patient's last menstrual period was 04/09/2016.          Sexually active: Yes.    The current method of family planning is none.    Exercising: Yes.    walking Smoker:  no  Health Maintenance: Pap:  Unsure  History of abnormal Pap:  no MMG:  Never Colonoscopy:  Never BMD:   Never TDaP:  2017 Gardasil: N/A   reports that she has never smoked. She has never used smokeless tobacco. She reports that she does not drink alcohol or use drugs.She is a [employer]arry manager, and a Web designer. Works with Dementia patients  Past Medical History:  Diagnosis Date  . Abnormal uterine bleeding   . Depression   . Endometriosis   . Fibroid   . Infertility, female   . Migraines   . STD (sexually  transmitted disease)    HSV I   Migraines without aura  Past Surgical History:  Procedure Laterality Date  . DILATION AND CURETTAGE OF UTERUS      Current Outpatient Prescriptions  Medication Sig Dispense Refill  . ibuprofen (ADVIL,MOTRIN) 600 MG tablet Take 1 tablet (600 mg total) by mouth every 6 (six) hours as needed. 30 tablet 0   No current facility-administered medications for this visit.     Family History  Problem Relation Age of Onset  . Obesity Mother   . Diabetes Mother   . Hypertension Mother   . Hyperlipidemia Mother   . Congestive Heart Failure Mother   . Congestive Heart Failure Father   . Hypertension Sister   . Hyperlipidemia Sister   . Obesity Sister   . Obesity Brother   . Congestive Heart Failure Brother   . Obesity Maternal Aunt   . Hypertension Sister   . Hyperlipidemia Sister     Review of Systems  Constitutional: Positive for unexpected weight change.       Weight gain   HENT: Negative.   Eyes: Negative.   Respiratory: Negative.   Cardiovascular: Negative.   Gastrointestinal: Negative.   Endocrine: Negative.   Genitourinary: Positive for menstrual problem.       Dysmenorrhea Heavy menstrual bleeding  Musculoskeletal: Negative.   Skin: Negative.  Allergic/Immunologic: Negative.   Neurological: Negative.   Psychiatric/Behavioral: The patient is nervous/anxious.   She got up to 300 lbs a few years ago, not able to loose more weight.   Exam:   BP (!) 160/94 (BP Location: Right Arm, Patient Position: Sitting, Cuff Size: Normal)   Pulse 72   Resp 16   Ht 5' 5.5" (1.664 m)   Wt 192 lb (87.1 kg)   LMP 04/09/2016   BMI 31.46 kg/m    Weight change: @WEIGHTCHANGE @ Height:   Height: 5' 5.5" (166.4 cm)  Ht Readings from Last 3 Encounters:  05/03/16 5' 5.5" (1.664 m)  12/02/13 5\' 5"  (1.651 m)    General appearance: alert, cooperative and appears stated age. She was intermittently teary during the visit. Head: Normocephalic, without  obvious abnormality, atraumatic Neck: no adenopathy, supple, symmetrical, trachea midline and thyroid normal to inspection and palpation Lungs: clear to auscultation bilaterally Cardiovascular: regular rate and rhythm Breasts: normal appearance, no masses or tenderness, bilaterally inverted nipples (long term) Abdomen: soft, non-tender; bowel sounds normal; no masses,  no organomegaly Extremities: extremities normal, atraumatic, no cyanosis or edema Skin: Skin color, texture, turgor normal. No rashes or lesions Lymph nodes: Cervical, supraclavicular, and axillary nodes normal. No abnormal inguinal nodes palpated Neurologic: Grossly normal   Pelvic: External genitalia:  no lesions              Urethra:  normal appearing urethra with no masses, tenderness or lesions              Bartholins and Skenes: normal                 Vagina: normal appearing vagina with normal color and an increase in watery, white vaginal d/c              Cervix: no lesions               Bimanual Exam:  Uterus:  anteverted, not tender, not appreciably enlarged              Adnexa: no mass, fullness, tenderness               Rectovaginal: Confirms               Anus:  normal sphincter tone, no lesions  Chaperone was present for exam.  Ultrasound images from the fall reviewed with the patient  A:  Well Woman with normal exam  Menorrhagia  Severe dysmenorrhea  Fibroid uterus  Depression/anxiety  Overweight (overeats when sad)  Hypertension  Infertility, long term, was told she couldn't have children, then SAB in the fall  Long h/o oligomenorrhea, discussed that this increases her risk of endometrial abnormalities  Vaginal discharge  P:   Pap with hpv  Mammogram, #'s given  Screening labs  TSH/Ferritin  Return for u/s, possible sonohysterogram, likely endometrial biopsy (after her cycle)  Anaprox for cramps  Discussed the mirena IUD, information given  Start Celexa, f/u in 1 month  Names of counselors  given  Discussed exercise to help her mood and her weight  Discussed weight watchers  Names of primary MD's given, she needs to establish care, needs her BP in a normal range  Discussed that it is very unlikely that she would have a successful full term healthy baby at her age  Will check cycle day #3 FSH and estradiol  Discussed the option to see an infertility specialist (would likely need donor egg)  Wet prep probe  Addendum:  cervix deep in the vagina, use a long speculum  In addition to the annual exam another 20 minutes was spent face to face in counseling on depression/anxiety, infertility, weight loss and need for primary MD

## 2016-05-03 NOTE — Patient Instructions (Signed)
On the 3rd day of your cycle get your blood work done!!   EXERCISE AND DIET:  We recommended that you start or continue a regular exercise program for good health. Regular exercise means any activity that makes your heart beat faster and makes you sweat.  We recommend exercising at least 30 minutes per day at least 3 days a week, preferably 4 or 5.  We also recommend a diet low in fat and sugar.  Inactivity, poor dietary choices and obesity can cause diabetes, heart attack, stroke, and kidney damage, among others.    ALCOHOL AND SMOKING:  Women should limit their alcohol intake to no more than 7 drinks/beers/glasses of wine (combined, not each!) per week. Moderation of alcohol intake to this level decreases your risk of breast cancer and liver damage. And of course, no recreational drugs are part of a healthy lifestyle.  And absolutely no smoking or even second hand smoke. Most people know smoking can cause heart and lung diseases, but did you know it also contributes to weakening of your bones? Aging of your skin?  Yellowing of your teeth and nails?  CALCIUM AND VITAMIN D:  Adequate intake of calcium and Vitamin D are recommended.  The recommendations for exact amounts of these supplements seem to change often, but generally speaking 600 mg of calcium (either carbonate or citrate) and 800 units of Vitamin D per day seems prudent. Certain women may benefit from higher intake of Vitamin D.  If you are among these women, your doctor will have told you during your visit.    PAP SMEARS:  Pap smears, to check for cervical cancer or precancers,  have traditionally been done yearly, although recent scientific advances have shown that most women can have pap smears less often.  However, every woman still should have a physical exam from her gynecologist every year. It will include a breast check, inspection of the vulva and vagina to check for abnormal growths or skin changes, a visual exam of the cervix, and then  an exam to evaluate the size and shape of the uterus and ovaries.  And after 47 years of age, a rectal exam is indicated to check for rectal cancers. We will also provide age appropriate advice regarding health maintenance, like when you should have certain vaccines, screening for sexually transmitted diseases, bone density testing, colonoscopy, mammograms, etc.   MAMMOGRAMS:  All women over 49 years old should have a yearly mammogram. Many facilities now offer a "3D" mammogram, which may cost around $50 extra out of pocket. If possible,  we recommend you accept the option to have the 3D mammogram performed.  It both reduces the number of women who will be called back for extra views which then turn out to be normal, and it is better than the routine mammogram at detecting truly abnormal areas.    COLONOSCOPY:  Colonoscopy to screen for colon cancer is recommended for all women at age 58.  We know, you hate the idea of the prep.  We agree, BUT, having colon cancer and not knowing it is worse!!  Colon cancer so often starts as a polyp that can be seen and removed at colonscopy, which can quite literally save your life!  And if your first colonoscopy is normal and you have no family history of colon cancer, most women don't have to have it again for 10 years.  Once every ten years, you can do something that may end up saving your life, right?  We will be happy to help you get it scheduled when you are ready.  Be sure to check your insurance coverage so you understand how much it will cost.  It may be covered as a preventative service at no cost, but you should check your particular policy.      Major Depressive Disorder, Adult Major depressive disorder (MDD) is a mental health condition. It may also be called clinical depression or unipolar depression. MDD usually causes feelings of sadness, hopelessness, or helplessness. MDD can also cause physical symptoms. It can interfere with work, school, relationships,  and other everyday activities. MDD may be mild, moderate, or severe. It may occur once (single episode major depressive disorder) or it may occur multiple times (recurrent major depressive disorder). What are the causes? The exact cause of this condition is not known. MDD is most likely caused by a combination of things, which may include:  Genetic factors. These are traits that are passed along from parent to child.  Individual factors. Your personality, your behavior, and the way you handle your thoughts and feelings may contribute to MDD. This includes personality traits and behaviors learned from others.  Physical factors, such as:  Differences in the part of your brain that controls emotion. This part of your brain may be different than it is in people who do not have MDD.  Long-term (chronic) medical or psychiatric illnesses.  Social factors. Traumatic experiences or major life changes may play a role in the development of MDD. What increases the risk? This condition is more likely to develop in women. The following factors may also make you more likely to develop MDD:  A family history of depression.  Troubled family relationships.  Abnormally low levels of certain brain chemicals.  Traumatic events in childhood, especially abuse or the loss of a parent.  Being under a lot of stress, or long-term stress, especially from upsetting life experiences or losses.  A history of:  Chronic physical illness.  Other mental health disorders.  Substance abuse.  Poor living conditions.  Experiencing social exclusion or discrimination on a regular basis. What are the signs or symptoms? The main symptoms of MDD typically include:  Constant depressed or irritable mood.  Loss of interest in things and activities. MDD symptoms may also include:  Sleeping or eating too much or too little.  Unexplained weight change.  Fatigue or low energy.  Feelings of worthlessness or  guilt.  Difficulty thinking clearly or making decisions.  Thoughts of suicide or of harming others.  Physical agitation or weakness.  Isolation. Severe cases of MDD may also occur with other symptoms, such as:  Delusions or hallucinations, in which you imagine things that are not real (psychotic depression).  Low-level depression that lasts at least a year (chronic depression or persistent depressive disorder).  Extreme sadness and hopelessness (melancholic depression).  Trouble speaking and moving (catatonic depression). How is this diagnosed? This condition may be diagnosed based on:  Your symptoms.  Your medical history, including your mental health history. This may involve tests to evaluate your mental health. You may be asked questions about your lifestyle, including any drug and alcohol use, and how long you have had symptoms of MDD.  A physical exam.  Blood tests to rule out other conditions. You must have a depressed mood and at least four other MDD symptoms most of the day, nearly every day in the same 2-week timeframe before your health care provider can confirm a diagnosis of MDD. How  is this treated? This condition is usually treated by mental health professionals, such as psychologists, psychiatrists, and clinical social workers. You may need more than one type of treatment. Treatment may include:  Psychotherapy. This is also called talk therapy or counseling. Types of psychotherapy include:  Cognitive behavioral therapy (CBT). This type of therapy teaches you to recognize unhealthy feelings, thoughts, and behaviors, and replace them with positive thoughts and actions.  Interpersonal therapy (IPT). This helps you to improve the way you relate to and communicate with others.  Family therapy. This treatment includes members of your family.  Medicine to treat anxiety and depression, or to help you control certain emotions and behaviors.  Lifestyle changes, such  as:  Limiting alcohol and drug use.  Exercising regularly.  Getting plenty of sleep.  Making healthy eating choices.  Spending more time outdoors. Treatments involving stimulation of the brain can be used in situations with extremely severe symptoms, or when medicine or other therapies do not work over time. These treatments include electroconvulsive therapy, transcranial magnetic stimulation, and vagal nerve stimulation. Follow these instructions at home: Activity   Return to your normal activities as told by your health care provider.  Exercise regularly and spend time outdoors as told by your health care provider. General instructions   Take over-the-counter and prescription medicines only as told by your health care provider.  Do not drink alcohol. If you drink alcohol, limit your alcohol intake to no more than 1 drink a day for nonpregnant women and 2 drinks a day for men. One drink equals 12 oz of beer, 5 oz of wine, or 1 oz of hard liquor. Alcohol can affect any antidepressant medicines you are taking. Talk to your health care provider about your alcohol use.  Eat a healthy diet and get plenty of sleep.  Find activities that you enjoy doing, and make time to do them.  Consider joining a support group. Your health care provider may be able to recommend a support group.  Keep all follow-up visits as told by your health care provider. This is important. Where to find more information: [employer] on Mental Illness  www.nami.org [place]. National Institute of Mental Health  https://carter.com/ National Suicide Prevention Lifeline  1-800-273-TALK (202)807-7282). This is free, 24-hour help. Contact a health care provider if:  Your symptoms get worse.  You develop new symptoms. Get help right away if:  You self-harm.  You have serious thoughts about hurting yourself or others.  You see, hear, taste, smell, or feel things that are not present (hallucinate). This  information is not intended to replace advice given to you by your health care provider. Make sure you discuss any questions you have with your health care provider. Document Released: 05/27/2012 Document Revised: 10/07/2015 Document Reviewed: 08/11/2015 Elsevier Interactive Patient Education  2017 [employer].

## 2016-05-04 ENCOUNTER — Telehealth: Payer: Self-pay | Admitting: Obstetrics and Gynecology

## 2016-05-04 LAB — WET PREP BY MOLECULAR PROBE
Candida species: NOT DETECTED
Gardnerella vaginalis: DETECTED — AB
Trichomonas vaginosis: NOT DETECTED

## 2016-05-04 LAB — VITAMIN D 25 HYDROXY (VIT D DEFICIENCY, FRACTURES): Vit D, 25-Hydroxy: 12 ng/mL — ABNORMAL LOW (ref 30–100)

## 2016-05-04 LAB — HEMOGLOBIN A1C
Hgb A1c MFr Bld: 5.1 % (ref <5.7)
Mean Plasma Glucose: 100 mg/dL

## 2016-05-04 NOTE — Telephone Encounter (Signed)
Attempted to contact patient to review benefits for recommended tests. Patients phone did not have a voicemail set up, therefore I was unable to leave a message

## 2016-05-05 ENCOUNTER — Telehealth: Payer: Self-pay

## 2016-05-05 NOTE — Telephone Encounter (Signed)
Attempted to reach patient at number provided [phone]. There was no answer and recording states that the voicemail box has not been set up yet and is not accepting messages at this time.

## 2016-05-05 NOTE — Telephone Encounter (Signed)
-----   Message from Salvadore Dom, MD sent at 05/04/2016  4:24 PM EDT ----- Please inform the patient that her vaginitis probe was + for BV and treat with flagyl (either oral or vaginal, her choice), no ETOH while on Flagyl.  Oral: Flagyl 500 mg BID x 7 days, or Vaginal: Metrogel, 1 applicator per vagina q day x 5 days. Vit d is low. Please call in 50,000 IU of vit d 3, 1 tab po q week x 12 weeks, then she needs another vit d level (please set up). Her LDL (bad cholesterol) and total cholesterol are slightly high. The rest of her lipid panel is normal. She should eat a diet low in saturated fats and have her level rechecked fasting in 1 year.  The rest of her blood work was normal. Pap is pending. She should f/u for the ultrasound as previously discussed.

## 2016-05-05 NOTE — Telephone Encounter (Signed)
Spoke with patient regarding benefit for sonohysterogram.  Patient understood information presented. Per scheduling instructions we will need to schedule tests after her next cycle.  Advised patient to call at the onset of her next cycle, in order to schedule tests during optimal time of her cycle.  Patient understood and is agreeable.    Routing to Dr Talbert Nan for final review

## 2016-05-08 NOTE — Telephone Encounter (Signed)
patient is returning a call to [facility]

## 2016-05-08 NOTE — Telephone Encounter (Signed)
Attempted to reach patient at number provided [phone]. There was no answer and recording states that the voicemail box has not been set up yet and is not accepting messages at this time.

## 2016-05-09 LAB — IPS PAP TEST WITH HPV

## 2016-05-09 MED ORDER — VITAMIN D (ERGOCALCIFEROL) 1.25 MG (50000 UNIT) PO CAPS: 50000.0000 [IU] | ORAL_CAPSULE | ORAL | 0 refills | Status: DC

## 2016-05-09 MED ORDER — METRONIDAZOLE 500 MG PO TABS: 500.0000 mg | ORAL_TABLET | Freq: Two times a day (BID) | ORAL | 0 refills | Status: DC

## 2016-05-09 NOTE — Telephone Encounter (Signed)
Spoke with patient. Advised of message and results as seen below from [place]. Patient is agreeable and verbalizes understanding. Patient would like to start on Flagyl at this time. Rx for Flagyl 500 mg BID x 7 days #14 0RF sent to pharmacy on file. Avoid alcohol during treatment and 24 hours after completing medication. Don't mix with alcohol if mixed can cause severe nausea, vomiting and abdominal cramping. Rx for Vitamin D 50,000 IU take 1 tablet weekly #12 0RF sent to pharmacy on file. Patient declines to schedule recheck at this time. Will return call to schedule.  Routing to provider for final review. Patient agreeable to disposition. Will close encounter.

## 2016-05-11 ENCOUNTER — Encounter: Payer: Self-pay | Admitting: Family Medicine

## 2016-05-11 ENCOUNTER — Ambulatory Visit (INDEPENDENT_AMBULATORY_CARE_PROVIDER_SITE_OTHER): Payer: BLUE CROSS/BLUE SHIELD | Admitting: Family Medicine

## 2016-05-11 VITALS — BP 160/90 | HR 56 | Temp 98.3°F | Resp 18 | Ht 65.5 in | Wt 199.6 lb

## 2016-05-11 DIAGNOSIS — F4329 Adjustment disorder with other symptoms: Secondary | ICD-10-CM

## 2016-05-11 DIAGNOSIS — E559 Vitamin D deficiency, unspecified: Secondary | ICD-10-CM

## 2016-05-11 DIAGNOSIS — I10 Essential (primary) hypertension: Secondary | ICD-10-CM | POA: Diagnosis not present

## 2016-05-11 DIAGNOSIS — R635 Abnormal weight gain: Secondary | ICD-10-CM

## 2016-05-11 DIAGNOSIS — Z634 Disappearance and death of family member: Secondary | ICD-10-CM | POA: Diagnosis not present

## 2016-05-11 DIAGNOSIS — E785 Hyperlipidemia, unspecified: Secondary | ICD-10-CM

## 2016-05-11 DIAGNOSIS — Z87898 Personal history of other specified conditions: Secondary | ICD-10-CM

## 2016-05-11 DIAGNOSIS — E6609 Other obesity due to excess calories: Secondary | ICD-10-CM

## 2016-05-11 DIAGNOSIS — F4321 Adjustment disorder with depressed mood: Secondary | ICD-10-CM

## 2016-05-11 DIAGNOSIS — Z8669 Personal history of other diseases of the nervous system and sense organs: Secondary | ICD-10-CM

## 2016-05-11 DIAGNOSIS — Z6832 Body mass index (BMI) 32.0-32.9, adult: Secondary | ICD-10-CM

## 2016-05-11 MED ORDER — TOPIRAMATE 50 MG PO TABS: ORAL_TABLET | ORAL | 0 refills | Status: DC

## 2016-05-11 MED ORDER — LISINOPRIL-HYDROCHLOROTHIAZIDE 20-25 MG PO TABS: 1.0000 | ORAL_TABLET | Freq: Every day | ORAL | 0 refills | Status: DC

## 2016-05-11 NOTE — Telephone Encounter (Signed)
Patient returning your call.

## 2016-05-11 NOTE — Progress Notes (Signed)
Subjective:    Patient ID: Judy Rogers, female    DOB: [birth year], 46 y.o.   MRN: [id number] Chief Complaint  Patient presents with  . Establish Care    Referred by [facility] to get a PCP to follow on weight gain.    HPI   Topamax, phenteramine, and bp pills x 6 mos and did well x 2 yrs but then over the past 8 mos ago she started gaining. Feels hungry all time. She started at 300 lbs and she would eat little meals frequently, make sure to get breakfast. She got down to 140-150. Now she is doing sodas and vending machines.  She had   Access to these before.  She reports her BP prior was running 170s/90s before she tried the above regiemn. SHe monitors her BP at home and it ranges 140s-160s/ Her sister died at 37 yo of a MI due to obesity, father passed 6 mos ago, mother just had CVA with persistent sxs.  Her other sister was told she needed gastric bypass.   Now was told low vitamin D - she is going to restart on high dose.    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   tdap was within a year given at work.  Past Medical History:  Diagnosis Date  . Abnormal uterine bleeding   . Depression   . Endometriosis   . Fibroid   . Infertility, female   . Migraines   . STD (sexually transmitted disease)    HSV I    Past Surgical History:  Procedure Laterality Date  . DILATION AND CURETTAGE OF UTERUS     Current Outpatient Prescriptions on File Prior to Visit  Medication Sig Dispense Refill  . Vitamin D, Ergocalciferol, (DRISDOL) 50000 units CAPS capsule Take 1 capsule (50,000 Units total) by mouth every 7 (seven) days. 12 capsule 0   No current facility-administered medications on file prior to visit.    No Known Allergies Family History  Problem Relation Age of Onset  . Obesity Mother   . Diabetes Mother   .  Hypertension Mother   . Hyperlipidemia Mother   . Congestive Heart Failure Mother   . Congestive Heart Failure Father   . Hypertension Sister   . Hyperlipidemia Sister   . Obesity Sister   . Obesity Brother   . Congestive Heart Failure Brother   . Obesity Maternal Aunt   . Hypertension Sister   . Hyperlipidemia Sister    Social History   Social History  . Marital status: Single    Spouse name: N/A  . Number of children: N/A  . Years of education: N/A   Social History Main Topics  . Smoking status: Never Smoker  . Smokeless tobacco: Never Used  . Alcohol use No  . Drug use: No  . Sexual activity: Yes    Partners: Male    Birth control/ protection: None   Other Topics Concern  . None   Social History Narrative  . None   Depression screen [facility] 2/9 05/11/2016  Decreased Interest 0  Down, Depressed, Hopeless 0  PHQ - 2 Score 0    Review of Systems  Constitutional: Positive for activity change (decrease), appetite change (increase), fatigue and unexpected weight change (increase). Negative for chills, diaphoresis  and fever.  Eyes: Negative for visual disturbance.  Respiratory: Negative for cough and shortness of breath.   Cardiovascular: Positive for leg swelling. Negative for chest pain and palpitations.  Gastrointestinal: Negative for abdominal pain.  Genitourinary: Negative for decreased urine volume.  Musculoskeletal: Positive for arthralgias.  Neurological: Negative for dizziness, syncope, facial asymmetry, light-headedness and headaches.  Hematological: Does not bruise/bleed easily.  Psychiatric/Behavioral: Positive for behavioral problems. Negative for agitation, confusion and dysphoric mood. The patient is not nervous/anxious and is not hyperactive.        Objective:   Physical Exam  Constitutional: She is oriented to person, place, and time. She appears well-developed and well-nourished. No distress.  HENT:  Head: Normocephalic and atraumatic.  Right Ear:  External ear normal.  Eyes: Conjunctivae are normal. No scleral icterus.  Pulmonary/Chest: Effort normal.  Neurological: She is alert and oriented to person, place, and time.  Skin: Skin is warm and dry. She is not diaphoretic. No erythema.  Psychiatric: She has a normal mood and affect. Her behavior is normal.      BP (!) 160/90   Pulse (!) 56   Temp 98.3 F (36.8 C) (Oral)   Resp 18   Ht 5' 5.5" (1.664 m)   Wt 199 lb 9.6 oz (90.5 kg)   LMP 04/27/2016   SpO2 98%   BMI 32.71 kg/m      Assessment & Plan:   1. Essential hypertension - restart lisinopril-hctz which she reports she did well on prior.   Chart review after visit reveals that pt has struggled with infertility her whole life until a SAB 6 mos prior (which also triggered comfort eating). Apparently Patient is in a long-term monogamous relationship and has not ruled out the option of seeing an infertility specialist to attempt conception despite her gynecologist advising that it would be very unlikely that she would have a successful for full-term healthy baby and encouraged a Mirena IUD as patient suspected to have a history of PCOS and fibroids though is undergoing further evaluation. We may need to stop lisinopril while there is any possibility of fertility treatments as it would be devastating if she happened to conceive again she did 6 months ago and happened to be on a teratogenic medication. Need to consider implications of topiramate and phentermine in the setting of unexpected pregnancy as well. Discuss at follow-up visit in one week.   2. Weight gain - pt reports she lost 150 lbs in 6 mos on combination of topamax 100 qd and phenteramine several yrs ago w/o side effects and she was able to maintain the weight off for several years until she had multiple family losses and a move wihtin sev mos of each other and began eating for comfort. I will not start pt on phenteramine today due to her BP which she is very disappointed  about but advise to monitor bp outside office and I think as soon as this is controlled that that would be a reasonable next step  3. Hyperlipidemia, unspecified hyperlipidemia type - mild, lipid panel last wk w/ LDL 123 and non-hdl 138 - no med needed.  4. Complicated grief - eating has been a way of coping through multiple family losses recently - clearly a coping mechanism that her family has modeled but pt seems to be appropriately acknowledging the emotional trauma and ready to use this to motivate her towards health - to not carry on the same habits that led to the loss.  I think it  is reasonable for her to hold off on citalopram at this point since starting other meds and pt does not really endorse a h/o sig mood d/o to me but more of an adjustment d/o which it seems she is hopefully coming out of as demonstrated by her presenting here for help. Pt willing to reconsider antidepressent in future prn.  5. Vitamin D deficiency - starting once wkly high dose from gyn  6. Class 1 obesity due to excess calories with serious comorbidity and body mass index (BMI) of 32.0 to 32.9 in adult   7.      H/o tension HAs - can't use nsaids due to HTN and  Minimally responsive to tylenol so start topamax for prevention  Bmp and EKG at f/u OV in 1 wk  Meds ordered this encounter  Medications  . topiramate (TOPAMAX) 50 MG tablet    Sig: Take 1/2 tab po bid x 1 wk, then 1 tab po bid    Dispense:  60 tablet    Refill:  0  . lisinopril-hydrochlorothiazide (PRINZIDE,ZESTORETIC) 20-25 MG tablet    Sig: Take 1 tablet by mouth daily.    Dispense:  30 tablet    Refill:  0      Delman Cheadle, M.D.  [facility] [address] [place], [place] [address] [phone] phone [phone] fax  05/12/16 11:09 AM

## 2016-05-11 NOTE — Patient Instructions (Addendum)
GOAL BLOOD PRESSURE FOR YOU IS 110/70!!    IF you received an x-ray today, you will receive an invoice from [facility]. Please contact [facility] at [phone] with questions or concerns regarding your invoice.   IF you received labwork today, you will receive an invoice from [place]. Please contact [facility] at [phone] with questions or concerns regarding your invoice.   Our billing staff will not be able to assist you with questions regarding bills from these companies.  You will be contacted with the lab results as soon as they are available. The fastest way to get your results is to activate your My Chart account. Instructions are located on the last page of this paperwork. If you have not heard from [place] regarding the results in 2 weeks, please contact this office.      Hypertension Hypertension, commonly called high blood pressure, is when the force of blood pumping through the arteries is too strong. The arteries are the blood vessels that carry blood from the heart throughout the body. Hypertension forces the heart to work harder to pump blood and may cause arteries to become narrow or stiff. Having untreated or uncontrolled hypertension can cause heart attacks, strokes, kidney disease, and other problems. A blood pressure reading consists of a higher number over a lower number. Ideally, your blood pressure should be below 120/80. The first ("top") number is called the systolic pressure. It is a measure of the pressure in your arteries as your heart beats. The second ("bottom") number is called the diastolic pressure. It is a measure of the pressure in your arteries as the heart relaxes. What are the causes? The cause of this condition is not known. What increases the risk? Some risk factors for high blood pressure are under your control. Others are not. Factors you can change   Smoking.  Having type 2 diabetes mellitus, high cholesterol, or both.  Not  getting enough exercise or physical activity.  Being overweight.  Having too much fat, sugar, calories, or salt (sodium) in your diet.  Drinking too much alcohol. Factors that are difficult or impossible to change   Having chronic kidney disease.  Having a family history of high blood pressure.  Age. Risk increases with age.  Race. You may be at higher risk if you are African-American.  Gender. Men are at higher risk than women before age 5. After age 44, women are at higher risk than men.  Having obstructive sleep apnea.  Stress. What are the signs or symptoms? Extremely high blood pressure (hypertensive crisis) may cause:  Headache.  Anxiety.  Shortness of breath.  Nosebleed.  Nausea and vomiting.  Severe chest pain.  Jerky movements you cannot control (seizures). How is this diagnosed? This condition is diagnosed by measuring your blood pressure while you are seated, with your arm resting on a surface. The cuff of the blood pressure monitor will be placed directly against the skin of your upper arm at the level of your heart. It should be measured at least twice using the same arm. Certain conditions can cause a difference in blood pressure between your right and left arms. Certain factors can cause blood pressure readings to be lower or higher than normal (elevated) for a short period of time:  When your blood pressure is higher when you are in a health care provider's office than when you are at home, this is called white coat hypertension. Most people with this condition do not need medicines.  When your  blood pressure is higher at home than when you are in a health care provider's office, this is called masked hypertension. Most people with this condition may need medicines to control blood pressure. If you have a high blood pressure reading during one visit or you have normal blood pressure with other risk factors:  You may be asked to return on a different day  to have your blood pressure checked again.  You may be asked to monitor your blood pressure at home for 1 week or longer. If you are diagnosed with hypertension, you may have other blood or imaging tests to help your health care provider understand your overall risk for other conditions. How is this treated? This condition is treated by making healthy lifestyle changes, such as eating healthy foods, exercising more, and reducing your alcohol intake. Your health care provider may prescribe medicine if lifestyle changes are not enough to get your blood pressure under control, and if:  Your systolic blood pressure is above 130.  Your diastolic blood pressure is above 80. Your personal target blood pressure may vary depending on your medical conditions, your age, and other factors. Follow these instructions at home: Eating and drinking   Eat a diet that is high in fiber and potassium, and low in sodium, added sugar, and fat. An example eating plan is called the DASH (Dietary Approaches to Stop Hypertension) diet. To eat this way:  Eat plenty of fresh fruits and vegetables. Try to fill half of your plate at each meal with fruits and vegetables.  Eat whole grains, such as whole wheat pasta, brown rice, or whole grain bread. Fill about one quarter of your plate with whole grains.  Eat or drink low-fat dairy products, such as skim milk or low-fat yogurt.  Avoid fatty cuts of meat, processed or cured meats, and poultry with skin. Fill about one quarter of your plate with lean proteins, such as fish, chicken without skin, beans, eggs, and tofu.  Avoid premade and processed foods. These tend to be higher in sodium, added sugar, and fat.  Reduce your daily sodium intake. Most people with hypertension should eat less than 1,500 mg of sodium a day.  Limit alcohol intake to no more than 1 drink a day for nonpregnant women and 2 drinks a day for men. One drink equals 12 oz of beer, 5 oz of wine, or 1 oz  of hard liquor. Lifestyle   Work with your health care provider to maintain a healthy body weight or to lose weight. Ask what an ideal weight is for you.  Get at least 30 minutes of exercise that causes your heart to beat faster (aerobic exercise) most days of the week. Activities may include walking, swimming, or biking.  Include exercise to strengthen your muscles (resistance exercise), such as pilates or lifting weights, as part of your weekly exercise routine. Try to do these types of exercises for 30 minutes at least 3 days a week.  Do not use any products that contain nicotine or tobacco, such as cigarettes and e-cigarettes. If you need help quitting, ask your health care provider.  Monitor your blood pressure at home as told by your health care provider.  Keep all follow-up visits as told by your health care provider. This is important. Medicines   Take over-the-counter and prescription medicines only as told by your health care provider. Follow directions carefully. Blood pressure medicines must be taken as prescribed.  Do not skip doses of blood pressure medicine.  Doing this puts you at risk for problems and can make the medicine less effective.  Ask your health care provider about side effects or reactions to medicines that you should watch for. Contact a health care provider if:  You think you are having a reaction to a medicine you are taking.  You have headaches that keep coming back (recurring).  You feel dizzy.  You have swelling in your ankles.  You have trouble with your vision. Get help right away if:  You develop a severe headache or confusion.  You have unusual weakness or numbness.  You feel faint.  You have severe pain in your chest or abdomen.  You vomit repeatedly.  You have trouble breathing. Summary  Hypertension is when the force of blood pumping through your arteries is too strong. If this condition is not controlled, it may put you at risk  for serious complications.  Your personal target blood pressure may vary depending on your medical conditions, your age, and other factors. For most people, a normal blood pressure is less than 120/80.  Hypertension is treated with lifestyle changes, medicines, or a combination of both. Lifestyle changes include weight loss, eating a healthy, low-sodium diet, exercising more, and limiting alcohol. This information is not intended to replace advice given to you by your health care provider. Make sure you discuss any questions you have with your health care provider. Document Released: 01/30/2005 Document Revised: 12/29/2015 Document Reviewed: 12/29/2015 Elsevier Interactive Patient Education  2017 [employer].

## 2016-05-11 NOTE — Telephone Encounter (Signed)
Unable to reach. Message states voicemail box has not been set up yet, unable to leave message.

## 2016-05-11 NOTE — Telephone Encounter (Signed)
Routing to Dr Quincy Simmonds, on call provider.

## 2016-05-11 NOTE — Telephone Encounter (Signed)
Reviewed with Dr Talbert Nan, patient must come to office or go directly to [facility]. Call to patient. States she is not doing so well today. Clarified that she is a week late for menses and patient states this is correct but this is normal as her "cycle is irregular." Clarified amount of bleeding that she is bleeding thru pad/tampon Q 15- 20 minutes. Patient confirms this is correct bur again states this is her normal. Confirmed I understand she declined recommended office visit. Patient confirms she is not coming in.  Asked patient what she was requesting from [place] today, "how can we help her"  Patient reports that Motrin is not helping. Advised patient if she needs something more for pain, office visit is required for assessment and call immediately drops. Attempt to call patient back, no answer, no voice mail.  Routing to provider for final review.

## 2016-05-11 NOTE — Telephone Encounter (Signed)
Spoke with patient. Patient states she is 1 week late with cycle, started 05/10/16. Patient states she had to call out of work yesterday d/t cramps being unbearable. Patient states she has told Dr. Talbert Nan that she has taken 800 mg of ibuprofen in past and nothing works. Patient states clotting is heavy and reports changing pad/tampon q15-20 minutes. Recommended OV for today with Dr. Talbert Nan for further evaluation, patient declined. Patient states Dr. Talbert Nan is aware this is my normal, not coming in if I can't get out of bed to go to work. Patient denies dizziness, fatigue, weakness. Patient reports nausea. Patient states something has to be done, not scheduling [facility]. Patient again declined OV for further evaluation. Advised patient would update Dr. Talbert Nan and return call with any additional recommendations.   Dr. Talbert Nan, please advise?

## 2016-05-11 NOTE — Telephone Encounter (Signed)
Patient is calling to report that her cycle was late but did not start until 05/09/16. Patient has been taking the medication Dr.Jertson prescribed, with little relief.

## 2016-05-12 DIAGNOSIS — E6609 Other obesity due to excess calories: Secondary | ICD-10-CM | POA: Insufficient documentation

## 2016-05-12 DIAGNOSIS — E785 Hyperlipidemia, unspecified: Secondary | ICD-10-CM | POA: Insufficient documentation

## 2016-05-12 DIAGNOSIS — E559 Vitamin D deficiency, unspecified: Secondary | ICD-10-CM | POA: Insufficient documentation

## 2016-05-12 DIAGNOSIS — Z6832 Body mass index (BMI) 32.0-32.9, adult: Secondary | ICD-10-CM

## 2016-05-12 DIAGNOSIS — I10 Essential (primary) hypertension: Secondary | ICD-10-CM | POA: Insufficient documentation

## 2016-05-12 DIAGNOSIS — E66811 Obesity, class 1: Secondary | ICD-10-CM | POA: Insufficient documentation

## 2016-05-15 ENCOUNTER — Telehealth: Payer: Self-pay | Admitting: Obstetrics and Gynecology

## 2016-05-15 NOTE — Telephone Encounter (Signed)
Called patient to follow up in regards to scheduling recommended sonohysterogram and endometrial biopsy. I was unable to reach the patient, as her voicemail was not set up

## 2016-05-15 NOTE — Telephone Encounter (Signed)
Pt would like to know if Dr Brigitte Pulse recommends her getting the B12 shot weekly or monthly  Please advise:[phone]

## 2016-05-15 NOTE — Telephone Encounter (Signed)
???    We did not discuss B12 shots at all and her B12 level has not been checked.  B12 shots are normally given monthly but only if people are low and have a gut abnormality leaving them unable to process oral B12.  She is on a once weekly rx vitamin D supplement - is this what she is referring to?

## 2016-05-16 ENCOUNTER — Telehealth: Payer: Self-pay

## 2016-05-16 NOTE — Telephone Encounter (Signed)
-----   Message from Salvadore Dom, MD sent at 05/10/2016  5:45 PM EDT ----- Please inform the patient that her pap returned with ASCUS, +HPV. She needs a colposcopy It was also + for BV and yeast. She was already treated for bv Now please treat with diflucan 150 mg x 1, may repeat in 72 hours if still symptomatic. #2, no refills

## 2016-05-16 NOTE — Telephone Encounter (Signed)
Attempted to reach patient at number provided [phone], there was no answer and recording states that the voicemail box has not been set up yet.

## 2016-05-16 NOTE — Telephone Encounter (Signed)
Vm not set up

## 2016-05-16 NOTE — Telephone Encounter (Signed)
Pt will discuss with dr Brigitte Pulse on the 12th

## 2016-05-18 NOTE — Telephone Encounter (Signed)
Attempted to reach patient at number provided [phone], there was no answer and recording states that the voicemail box has not been set up yet.

## 2016-05-23 ENCOUNTER — Encounter: Payer: Self-pay | Admitting: *Deleted

## 2016-05-23 ENCOUNTER — Telehealth: Payer: Self-pay | Admitting: Family Medicine

## 2016-05-23 NOTE — Telephone Encounter (Signed)
Dr.Jertson, would you like me to send patient a letter at this time?

## 2016-05-23 NOTE — Telephone Encounter (Signed)
I couldnot find this order? Please advise

## 2016-05-23 NOTE — Telephone Encounter (Signed)
Letter to your office for review. 

## 2016-05-23 NOTE — Telephone Encounter (Signed)
Yes, please send a letter.

## 2016-05-23 NOTE — Telephone Encounter (Signed)
I don't remember discussing vit B12 - has not been checked before and her red blood cells are normal size so no reason to suspect deficiency. Perhaps she was referring to the rx vit D supp pill which should be taken once a week.  How are her BPs??

## 2016-05-23 NOTE — Telephone Encounter (Signed)
Pt would like to know if Dr Brigitte Pulse recommends her getting the B12 shot weekly or monthly  Please advise:[phone]

## 2016-05-23 NOTE — Telephone Encounter (Signed)
Attempted to reach patient at number provided [phone], there was no answer and recording states that the voicemail box has not been set up yet.  Cc: Lamont Snowball, RN

## 2016-05-24 NOTE — Telephone Encounter (Signed)
Pt will discuss with dr Brigitte Pulse on the 12th               Virgia Land, RN Registered Nurse Signed  Creation Time: 05/16/2016 4:21 PM  Addend Delete  Bookmark Copy     V/m not set up               Shawnee Knapp, MD Physician Signed  Creation Time: 05/15/2016 5:48 PM  Bookmark Copy     ???  We did not discuss B12 shots at all and her B12 level has not been checked.  B12 shots are normally given monthly but only if people are low and have a gut abnormality leaving them unable to process oral B12.  She is on a once weekly rx vitamin D supplement - is this what she is referring to?

## 2016-05-25 ENCOUNTER — Ambulatory Visit: Payer: BLUE CROSS/BLUE SHIELD | Admitting: Family Medicine

## 2016-05-25 DIAGNOSIS — R87810 Cervical high risk human papillomavirus (HPV) DNA test positive: Secondary | ICD-10-CM

## 2016-05-25 DIAGNOSIS — R87610 Atypical squamous cells of undetermined significance on cytologic smear of cervix (ASC-US): Secondary | ICD-10-CM | POA: Insufficient documentation

## 2016-05-25 NOTE — Progress Notes (Deleted)
   Subjective:    Patient ID: Judy Rogers, female    DOB: September 30, 1970, 46 y.o.   MRN: [id number]  HPI    Review of Systems     Objective:   Physical Exam        Assessment & Plan:

## 2016-05-29 ENCOUNTER — Ambulatory Visit: Payer: BLUE CROSS/BLUE SHIELD | Admitting: Family Medicine

## 2016-05-29 NOTE — Progress Notes (Deleted)
   Subjective:    Patient ID: Jilliane Kazanjian, female    DOB: Jul 10, 1970, 46 y.o.   MRN: [id number]  HPI  Ms. Percival is a delightful 46 yo woman here to [place] on her hypertension and discuss medical treatment of obesity. woman here to [place] on her hypertension and discuss medical treatment of obesity.  HTN: Started pt on lisinopril-hctz 20-25 >2 wks prior.  Vitamin B12 deficiency? Vitamin D def: On once a week high-dose Obesity: Responded very well to phentermine and Topamax prior. However advised patient that she had to have her hypertension control before we could consider starting her on phentermine which she was disappointed about. Did start topiramate.  Review of Systems     Objective:   Physical Exam        Assessment & Plan:  Patient is aware that pregnancy is completely contraindicated with her current medication regimen as on several category X medications.

## 2016-05-29 NOTE — Telephone Encounter (Signed)
Pt had late cancelled/no showed several scheduled visits w/ me. Below are the notes that I had made in preparation for her visit.    Ms. Baines is a delightful 46 yo woman here to [place] on her hypertension and discuss medical treatment of obesity.  HTN: Started pt on lisinopril-hctz 20-25 >2 wks prior.  Vitamin B12 deficiency? Vitamin D def: On once a week high-dose Obesity: Responded very well to phentermine and Topamax prior. However advised patient that she had to have her hypertension control before we could consider starting her on phentermine which she was disappointed about. Did start topiramate.  Patient is aware that pregnancy is completely contraindicated with her current medication regimen as on several category X medications.  Check UPT, BMP, EKG

## 2016-06-05 ENCOUNTER — Ambulatory Visit: Payer: BLUE CROSS/BLUE SHIELD | Admitting: Obstetrics and Gynecology

## 2016-06-05 ENCOUNTER — Telehealth: Payer: Self-pay | Admitting: Obstetrics and Gynecology

## 2016-06-06 ENCOUNTER — Encounter: Payer: Self-pay | Admitting: Obstetrics and Gynecology

## 2016-06-06 NOTE — Telephone Encounter (Signed)
Patient Judy Rogers her depression follow up appointment 06/05/16 at 4:00 pm. I was unable to reach patient by phone and mailed a letter. [facility] policy followed.

## 2016-06-15 ENCOUNTER — Other Ambulatory Visit: Payer: Self-pay | Admitting: Family Medicine

## 2016-06-15 NOTE — Telephone Encounter (Signed)
Needs OV for any further refills. 

## 2016-07-15 ENCOUNTER — Other Ambulatory Visit: Payer: Self-pay | Admitting: Family Medicine

## 2016-07-15 NOTE — Telephone Encounter (Signed)
Pt is long-overdue for a f/u OV. Please have her sched an appt within the next month so we can continue to make sure the medicines are keeping her healthy and safe before we refill them further. Thanks. 

## 2016-07-17 ENCOUNTER — Telehealth: Payer: Self-pay | Admitting: Family Medicine

## 2016-07-17 NOTE — Telephone Encounter (Signed)
MAILBOX FULL PT IS OVERDUE FOR A OV F/U WITH [facility] FOR MED REFILL AND MAKING SURE THEY ARE WORKING

## 2016-07-18 ENCOUNTER — Ambulatory Visit: Payer: BLUE CROSS/BLUE SHIELD | Admitting: Family Medicine

## 2016-07-19 ENCOUNTER — Ambulatory Visit (INDEPENDENT_AMBULATORY_CARE_PROVIDER_SITE_OTHER): Payer: BLUE CROSS/BLUE SHIELD | Admitting: Family Medicine

## 2016-07-19 ENCOUNTER — Encounter: Payer: Self-pay | Admitting: Family Medicine

## 2016-07-19 VITALS — BP 117/70 | HR 52 | Temp 98.2°F | Resp 18 | Ht 65.0 in | Wt 187.4 lb

## 2016-07-19 VITALS — BP 128/73 | HR 55 | Temp 98.2°F | Resp 18 | Ht 65.5 in | Wt 185.0 lb

## 2016-07-19 DIAGNOSIS — I10 Essential (primary) hypertension: Secondary | ICD-10-CM

## 2016-07-19 DIAGNOSIS — Z309 Encounter for contraceptive management, unspecified: Secondary | ICD-10-CM | POA: Diagnosis not present

## 2016-07-19 DIAGNOSIS — E6609 Other obesity due to excess calories: Secondary | ICD-10-CM

## 2016-07-19 DIAGNOSIS — D251 Intramural leiomyoma of uterus: Secondary | ICD-10-CM | POA: Diagnosis not present

## 2016-07-19 DIAGNOSIS — Z5181 Encounter for therapeutic drug level monitoring: Secondary | ICD-10-CM

## 2016-07-19 DIAGNOSIS — E282 Polycystic ovarian syndrome: Secondary | ICD-10-CM

## 2016-07-19 DIAGNOSIS — E559 Vitamin D deficiency, unspecified: Secondary | ICD-10-CM | POA: Diagnosis not present

## 2016-07-19 DIAGNOSIS — Z6830 Body mass index (BMI) 30.0-30.9, adult: Secondary | ICD-10-CM

## 2016-07-19 DIAGNOSIS — N970 Female infertility associated with anovulation: Secondary | ICD-10-CM | POA: Diagnosis not present

## 2016-07-19 MED ORDER — PHENTERMINE HCL 37.5 MG PO TABS: 37.5000 mg | ORAL_TABLET | Freq: Every day | ORAL | 2 refills | Status: DC

## 2016-07-19 MED ORDER — TOPIRAMATE 50 MG PO TABS: 50.0000 mg | ORAL_TABLET | Freq: Two times a day (BID) | ORAL | 1 refills | Status: DC

## 2016-07-19 MED ORDER — LISINOPRIL-HYDROCHLOROTHIAZIDE 20-25 MG PO TABS: 1.0000 | ORAL_TABLET | Freq: Every day | ORAL | 3 refills | Status: DC

## 2016-07-19 MED ORDER — PRENATAL PLUS 27-1 MG PO TABS: 1.0000 | ORAL_TABLET | Freq: Every day | ORAL | 4 refills | Status: DC

## 2016-07-19 NOTE — Patient Instructions (Signed)
     IF you received an x-ray today, you will receive an invoice from [facility]. Please contact [facility] at [phone] with questions or concerns regarding your invoice.   IF you received labwork today, you will receive an invoice from [facility]. Please contact [facility] at [phone] with questions or concerns regarding your invoice.   Our billing staff will not be able to assist you with questions regarding bills from these companies.  You will be contacted with the lab results as soon as they are available. The fastest way to get your results is to activate your My Chart account. Instructions are located on the last page of this paperwork. If you have not heard from us regarding the results in 2 weeks, please contact this office.     

## 2016-07-19 NOTE — Progress Notes (Signed)
Subjective:  By signing my name below, I, Judy Rogers, attest that this documentation has been prepared under the direction and in the presence of Delman Cheadle, MD Electronically Signed: Ladene Artist, ED Scribe 07/19/2016 at 4:37 PM.   Patient ID: Judy Rogers, female    DOB: 1970-04-17, 46 y.o.   MRN: [id number]  Chief Complaint  Patient presents with  . Hypertension    blood pressure check  . Vitamin D check   HPI Judy Rogers is a 46 y.o. female who presents to Primary Care at [facility] for a follow-up on HTN and Vitamin D check. Pt checks her BP at work and denies side-effects after starting Lisinopril-HCTZ. She states that she has cut back on chips, candy, Pepsi, [facility] and increased water intake. She has been eating a lot of grapes and apples but rarely consumes salads, cheese or milk. Pt has not been able to exercise yet with the exception of walking at work, but plans to join [employer] this week. She still wants to start Phentermine at this visit.  Wt Readings from Last 3 Encounters:  07/19/16 187 lb 6.4 oz (85 kg)  07/19/16 185 lb (83.9 kg)  05/11/16 199 lb 9.6 oz (90.5 kg)   Migraines Pt takes 2 Topamax daily for migraines without any side-effects. States migraines have been controlled on medication and she feels well overall.  Past Medical History:  Diagnosis Date  . Abnormal uterine bleeding   . Depression   . Endometriosis   . Fibroid   . Infertility, female   . Migraines   . STD (sexually transmitted disease)    HSV I    Current Outpatient Prescriptions on File Prior to Visit  Medication Sig Dispense Refill  . lisinopril-hydrochlorothiazide (PRINZIDE,ZESTORETIC) 20-25 MG tablet TAKE 1 TABLET BY MOUTH DAILY 30 tablet 1  . topiramate (TOPAMAX) 50 MG tablet TAKE 1 TABLET(50 MG) BY MOUTH TWICE DAILY 60 tablet 0  . Vitamin D, Ergocalciferol, (DRISDOL) 50000 units CAPS capsule Take 1 capsule (50,000 Units total) by mouth every 7 (seven) days. 12 capsule 0    No current facility-administered medications on file prior to visit.    No Known Allergies   Past Surgical History:  Procedure Laterality Date  . DILATION AND CURETTAGE OF UTERUS     Family History  Problem Relation Age of Onset  . Obesity Mother   . Diabetes Mother   . Hypertension Mother   . Hyperlipidemia Mother   . Congestive Heart Failure Mother   . Congestive Heart Failure Father   . Hypertension Sister   . Hyperlipidemia Sister   . Obesity Sister   . Obesity Brother   . Congestive Heart Failure Brother   . Obesity Maternal Aunt   . Hypertension Sister   . Hyperlipidemia Sister    Social History   Social History  . Marital status: Single    Spouse name: N/A  . Number of children: N/A  . Years of education: N/A   Social History Main Topics  . Smoking status: Never Smoker  . Smokeless tobacco: Never Used  . Alcohol use No  . Drug use: No  . Sexual activity: Yes    Partners: Male    Birth control/ protection: None   Other Topics Concern  . None   Social History Narrative  . None   Depression screen [facility] 2/9 07/19/2016 07/19/2016 05/11/2016  Decreased Interest 0 0 0  Down, Depressed, Hopeless 0 0 0  PHQ - 2 Score 0 0  0    Review of Systems  Neurological: Negative for headaches.      Objective:   Physical Exam  Constitutional: She is oriented to person, place, and time. She appears well-developed and well-nourished. No distress.  HENT:  Head: Normocephalic and atraumatic.  Eyes: Conjunctivae and EOM are normal.  Neck: Neck supple. No tracheal deviation present.  Slight thyroid enlargement  Cardiovascular: Normal rate, regular rhythm, S1 normal, S2 normal and normal heart sounds.   Pulmonary/Chest: Effort normal and breath sounds normal. No respiratory distress.  Musculoskeletal: Normal range of motion.  Lymphadenopathy:    She has no cervical adenopathy.  Neurological: She is alert and oriented to person, place, and time.  Skin: Skin is warm and  dry.  Psychiatric: She has a normal mood and affect. Her behavior is normal.  Nursing note and vitals reviewed.  BP 117/70   Pulse (!) 52   Temp 98.2 F (36.8 C) (Oral)   Resp 18   Ht 5\' 5"  (1.651 m)   Wt 187 lb 6.4 oz (85 kg)   SpO2 99%   BMI 31.18 kg/m     Assessment & Plan:   1. Essential hypertension   2. Medication monitoring encounter   3. Vitamin D deficiency   4. Class 1 obesity due to excess calories with serious comorbidity and body mass index (BMI) of 30.0 to 30.9 in adult      Orders Placed This Encounter  Procedures  . Basic metabolic panel    Order Specific Question:   Has the patient fasted?    Answer:   No  . VITAMIN D 25 Hydroxy (Vit-D Deficiency, Fractures)  . Care order/instruction:    Scheduling Instructions:     Complete orders, AVS and go.    Meds ordered this encounter  Medications  . phentermine (ADIPEX-P) 37.5 MG tablet    Sig: Take 1 tablet (37.5 mg total) by mouth daily before breakfast.    Dispense:  30 tablet    Refill:  2  . topiramate (TOPAMAX) 50 MG tablet    Sig: Take 1 tablet (50 mg total) by mouth 2 (two) times daily.    Dispense:  180 tablet    Refill:  1  . lisinopril-hydrochlorothiazide (PRINZIDE,ZESTORETIC) 20-25 MG tablet    Sig: Take 1 tablet by mouth daily.    Dispense:  90 tablet    Refill:  3  . prenatal vitamin w/FE, FA (PRENATAL 1 + 1) 27-1 MG TABS tablet    Sig: Take 1 tablet by mouth daily.    Dispense:  90 each    Refill:  4    Any prenatal mvi with minimum of: folic acid 026, calcium 400, vit D 400 is fine to substitute    I personally performed the services described in this documentation, which was scribed in my presence. The recorded information has been reviewed and considered, and addended by me as needed.   Delman Cheadle, M.D.  Primary Care at [facility] [address] [place], [place] [address] [phone] phone [phone] fax  07/20/16 12:48 AM

## 2016-07-19 NOTE — Patient Instructions (Addendum)
We recommend that you schedule a mammogram for breast cancer screening. Typically, you do not need a referral to do this. Please contact a local imaging center to schedule your mammogram.  [facility] - [phone]  *ask for the Radiology Department [facility] ([facility]) - [phone] or [phone]  [facility] - [phone] [facility] - [phone] [facility] - [phone]  *ask for the Radiology Department [facility] - [phone]  *ask for the Radiology Department [facility] - [phone]  *ask for the Mammography Department [facility] - [phone]     IF you received an x-ray today, you will receive an invoice from [facility]. Please contact [facility] at [phone] with questions or concerns regarding your invoice.   IF you received labwork today, you will receive an invoice from [facility]. Please contact [facility] at [phone] with questions or concerns regarding your invoice.   Our billing staff will not be able to assist you with questions regarding bills from these companies.  You will be contacted with the lab results as soon as they are available. The fastest way to get your results is to activate your My Chart account. Instructions are located on the last page of this paperwork. If you have not heard from us regarding the results in 2 weeks, please contact this office.      

## 2016-07-19 NOTE — Progress Notes (Signed)
Chief Complaint  Patient presents with  . IUD consult    wants to discuss getting Mirena    HPI   Patient is here for an IUD consultation With review of patient history patient reports that she has been diagnosed with PCOS since she was a teenager and in her early 61s she was told that she had infertility.  She states that at age 46 she had a D&C for abnormal bleeding She reports that she ended up married to the love of her life and states that she was so happy then in the past 2 days she started having regular periods.  A year and a half ago she had abdominal pain and was worked up and had a SAB.  She states that after that pregnancy and the miscarriage her husband took it so hard and felt like they should have seen a specialist.  She states that she was devastated.  She then had another SAB 10/30/15.  She reports that this further stressed her marriage and they are now separated.  She would like to get a Mirena IUD so that she does not keep having miscarriages and getting her hopes up.  She has never had a hysterosalpingogram She does not know if the lining of her uterus is normal  She knows that she has uterine fibroids  Past Medical History:  Diagnosis Date  . Abnormal uterine bleeding   . Depression   . Endometriosis   . Fibroid   . Infertility, female   . Migraines   . STD (sexually transmitted disease)    HSV I     Current Outpatient Prescriptions  Medication Sig Dispense Refill  . lisinopril-hydrochlorothiazide (PRINZIDE,ZESTORETIC) 20-25 MG tablet TAKE 1 TABLET BY MOUTH DAILY 30 tablet 1  . topiramate (TOPAMAX) 50 MG tablet TAKE 1 TABLET(50 MG) BY MOUTH TWICE DAILY 60 tablet 0  . Vitamin D, Ergocalciferol, (DRISDOL) 50000 units CAPS capsule Take 1 capsule (50,000 Units total) by mouth every 7 (seven) days. 12 capsule 0   No current facility-administered medications for this visit.     Allergies: No Known Allergies  Past Surgical History:  Procedure Laterality Date    . DILATION AND CURETTAGE OF UTERUS      Social History   Social History  . Marital status: Single    Spouse name: N/A  . Number of children: N/A  . Years of education: N/A   Social History Main Topics  . Smoking status: Never Smoker  . Smokeless tobacco: Never Used  . Alcohol use No  . Drug use: No  . Sexual activity: Yes    Partners: Male    Birth control/ protection: None   Other Topics Concern  . None   Social History Narrative  . None    ROS No fevers or chills  Objective: Vitals:   07/19/16 1131  BP: 128/73  Pulse: (!) 55  Resp: 18  Temp: 98.2 F (36.8 C)  TempSrc: Oral  SpO2: 99%  Weight: 185 lb (83.9 kg)  Height: 5' 5.5" (1.664 m)    Physical Exam  Constitutional: She is oriented to person, place, and time. She appears well-developed and well-nourished.  HENT:  Head: Normocephalic and atraumatic.  Pulmonary/Chest: Effort normal.  Neurological: She is alert and oriented to person, place, and time.  Psychiatric: Her behavior is normal. Judgment and thought content normal.   Tearful affect       IMPRESSION: No intrauterine gestational sac, yolk sac, fetal pole, or cardiac activity visualized. Differential  considerations include intrauterine gestation too early to be sonographically visualized, spontaneous abortion, or ectopic pregnancy. Consider follow-up ultrasound in 10 days and serial quantitative beta HCG follow-up.  Multiple uterine fibroids. Ill-defined hypoechoic area measuring up to 5.8 cm adjacent to the right uterine fundus -possibly a pedunculated fibroid but difficult to evaluate due to patient body habitus and position.  No free fluid.   Electronically Signed   By: Margarette Canada M.D.   On: 10/30/2015 14:57   Assessment and Plan Janisa was seen today for iud consult.  Diagnoses and all orders for this visit:  Intramural leiomyoma of uterus Infertility associated with anovulation PCOS (polycystic ovarian  syndrome) Encounter for contraceptive management, unspecified type -     Ambulatory referral to Obstetrics / Gynecology   Discussed with patient at length that she is facing some challenges but that reproductive technology has advanced and it is worth a consultation with a specialist.   Her fibroids, history of D&C and PCOS are challenges but she has conceived twice without assistance  I would not recommend an IUD right now since that would suppress her ovulation which now is regular.  She should consider all her options and if she decided against conception then an IUD should be placed by Gyne since she has a 5.8cm uterine fibroid to the right of her fundus  Also since this patient has kept relatively healthy and her only reason for not pursuing this was an age of 33 this provider discussed that she would be screened with chromosome testing and this is now routine since many women are successful even at 50  Discussed that she should go into the consultation aware of her challenges and to go with a listening ear to see what her options are and if a successful pregnancy is not likely she should discuss her options with Gynecology as uterine fibroid can make IUD placement challenging.    A total of 40 minutes were spent face-to-face with the patient during this encounter and over half of that time was spent on counseling and coordination of care.   [place]

## 2016-07-20 LAB — BASIC METABOLIC PANEL
BUN/Creatinine Ratio: 15 (ref 9–23)
BUN: 16 mg/dL (ref 6–24)
CO2: 26 mmol/L (ref 18–29)
CREATININE: 1.05 mg/dL — AB (ref 0.57–1.00)
Calcium: 9.5 mg/dL (ref 8.7–10.2)
Chloride: 101 mmol/L (ref 96–106)
GFR calc non Af Amer: 64 mL/min/{1.73_m2} (ref >59)
GFR, EST AFRICAN AMERICAN: 74 mL/min/{1.73_m2} (ref >59)
Glucose: 75 mg/dL (ref 65–99)
Potassium: 4.2 mmol/L (ref 3.5–5.2)
SODIUM: 138 mmol/L (ref 134–144)

## 2016-07-20 LAB — VITAMIN D 25 HYDROXY (VIT D DEFICIENCY, FRACTURES): Vit D, 25-Hydroxy: 65.2 ng/mL (ref 30.0–100.0)

## 2016-07-26 ENCOUNTER — Ambulatory Visit: Payer: BLUE CROSS/BLUE SHIELD | Admitting: Family Medicine

## 2016-07-28 ENCOUNTER — Ambulatory Visit: Payer: BLUE CROSS/BLUE SHIELD | Admitting: Family Medicine

## 2016-07-30 ENCOUNTER — Other Ambulatory Visit: Payer: Self-pay | Admitting: Obstetrics and Gynecology

## 2016-08-08 ENCOUNTER — Telehealth: Payer: Self-pay | Admitting: Family Medicine

## 2016-08-08 NOTE — Telephone Encounter (Signed)
Please advise 

## 2016-08-08 NOTE — Telephone Encounter (Signed)
Pt called because she [facility] & is saying she needs to speak w/ Dr. Nolon Rod ASAP about it, I believe she is probably not sure if she needs the referral anymore.   Please Advise

## 2016-08-09 NOTE — Telephone Encounter (Signed)
Pt did not want to follow up for infertility so was going to cancel the referral  She would like to move forward with the mirena She was advised to still follow up with Gynecology since her fibroid can make placing the IUD complex if there is no  Ultrasound.  She will establish with Gyncecology.

## 2016-08-17 ENCOUNTER — Other Ambulatory Visit: Payer: Self-pay | Admitting: Family Medicine

## 2016-11-07 ENCOUNTER — Other Ambulatory Visit: Payer: Self-pay | Admitting: Family Medicine

## 2016-11-09 ENCOUNTER — Other Ambulatory Visit: Payer: Self-pay | Admitting: Family Medicine

## 2017-05-09 ENCOUNTER — Ambulatory Visit: Payer: BLUE CROSS/BLUE SHIELD | Admitting: Obstetrics and Gynecology

## 2017-12-25 ENCOUNTER — Other Ambulatory Visit: Payer: Self-pay

## 2017-12-25 ENCOUNTER — Emergency Department (HOSPITAL_COMMUNITY)
Admission: EM | Admit: 2017-12-25 | Discharge: 2017-12-26 | Disposition: A | Discharge status: Home / Self Care | Payer: Self-pay | Attending: Emergency Medicine | Admitting: Emergency Medicine

## 2017-12-25 ENCOUNTER — Encounter (HOSPITAL_COMMUNITY): Payer: Self-pay | Admitting: Emergency Medicine

## 2017-12-25 DIAGNOSIS — F329 Major depressive disorder, single episode, unspecified: Secondary | ICD-10-CM | POA: Insufficient documentation

## 2017-12-25 DIAGNOSIS — N760 Acute vaginitis: Secondary | ICD-10-CM | POA: Insufficient documentation

## 2017-12-25 DIAGNOSIS — B9689 Other specified bacterial agents as the cause of diseases classified elsewhere: Secondary | ICD-10-CM

## 2017-12-25 DIAGNOSIS — I10 Essential (primary) hypertension: Secondary | ICD-10-CM | POA: Insufficient documentation

## 2017-12-25 LAB — URINALYSIS, ROUTINE W REFLEX MICROSCOPIC
Bilirubin Urine: NEGATIVE
GLUCOSE, UA: NEGATIVE mg/dL
Ketones, ur: NEGATIVE mg/dL
Leukocytes, UA: NEGATIVE
Nitrite: NEGATIVE
PH: 6 (ref 5.0–8.0)
Protein, ur: NEGATIVE mg/dL
SPECIFIC GRAVITY, URINE: 1.017 (ref 1.005–1.030)

## 2017-12-25 LAB — POC URINE PREG, ED: Preg Test, Ur: NEGATIVE

## 2017-12-25 NOTE — ED Notes (Signed)
Pt refused vitals 

## 2017-12-25 NOTE — ED Notes (Signed)
Pt requesting a female provider

## 2017-12-25 NOTE — ED Provider Notes (Signed)
[facility] DEPT Provider Note   CSN: [id number] Arrival date & time: 12/25/17  1709     History   Chief Complaint Chief Complaint  Patient presents with  . Urinary Frequency  . Back Pain    HPI Judy Rogers is a 47 y.o. female.  The history is provided by the patient and medical records.  Urinary Frequency   Back Pain       47 year old female with history of abnormal uterine bleeding, depression, endometriosis, presenting to the ED for urinary frequency and low back pain.  States for the past week she has had some lower back pain with urinary frequency and mild intermittent cramping.  She denies any dysuria or hematuria.  No fever or chills.  States she also started having white vaginal discharge 2 days ago.  States her sister recently changed her home laundry detergent and she has very sensitive skin and oftentimes this will irritate her.  She denies any new sexual partner, no expressed concern for STD.  Past Medical History:  Diagnosis Date  . Abnormal uterine bleeding   . Depression   . Endometriosis   . Fibroid   . Infertility, female   . Migraines   . STD (sexually transmitted disease)    HSV I     Patient Active Problem List   Diagnosis Date Noted  . ASCUS with positive high risk HPV cervical 05/25/2016  . Essential hypertension 05/12/2016  . Hyperlipidemia 05/12/2016  . Vitamin D deficiency 05/12/2016  . Class 1 obesity due to excess calories with serious comorbidity and body mass index (BMI) of 32.0 to 32.9 in adult 05/12/2016    Past Surgical History:  Procedure Laterality Date  . DILATION AND CURETTAGE OF UTERUS       OB History    Gravida  1   Para      Term      Preterm      AB  1   Living        SAB  1   TAB      Ectopic      Multiple      Live Births               Home Medications    Prior to Admission medications   Medication Sig Start Date End Date Taking? Authorizing Provider    acetaminophen (TYLENOL) 500 MG tablet Take 1,000 mg by mouth daily as needed for moderate pain.   Yes [provider]  lisinopril-hydrochlorothiazide (PRINZIDE,ZESTORETIC) 20-25 MG tablet Take 1 tablet by mouth daily. Patient not taking: Reported on 12/25/2017 07/19/16   Shawnee Knapp, MD  phentermine (ADIPEX-P) 37.5 MG tablet Take 1 tablet (37.5 mg total) by mouth daily before breakfast. Patient not taking: Reported on 12/25/2017 07/19/16   Shawnee Knapp, MD  prenatal vitamin w/FE, FA (PRENATAL 1 + 1) 27-1 MG TABS tablet Take 1 tablet by mouth daily. Patient not taking: Reported on 12/25/2017 07/19/16   Shawnee Knapp, MD  topiramate (TOPAMAX) 50 MG tablet Take 1 tablet (50 mg total) by mouth 2 (two) times daily. Patient not taking: Reported on 12/25/2017 07/19/16   Shawnee Knapp, MD  topiramate (TOPAMAX) 50 MG tablet TAKE 1 TABLET(50 MG) BY MOUTH TWICE DAILY Patient not taking: TAKE 1 TABLET(50 MG) BY MOUTH TWICE DAILY 08/17/16   Shawnee Knapp, MD  Vitamin D, Ergocalciferol, (DRISDOL) 50000 units CAPS capsule Take 1 capsule (50,000 Units total) by mouth every 7 (seven)  days. Patient not taking: Reported on 12/25/2017 05/09/16   Salvadore Dom, MD    Family History Family History  Problem Relation Age of Onset  . Obesity Mother   . Diabetes Mother   . Hypertension Mother   . Hyperlipidemia Mother   . Congestive Heart Failure Mother   . Congestive Heart Failure Father   . Hypertension Sister   . Hyperlipidemia Sister   . Obesity Sister   . Obesity Brother   . Congestive Heart Failure Brother   . Obesity Maternal Aunt   . Hypertension Sister   . Hyperlipidemia Sister     Social History Social History   Tobacco Use  . Smoking status: Never Smoker  . Smokeless tobacco: Never Used  Substance Use Topics  . Alcohol use: No  . Drug use: No     Allergies   Patient has no known allergies.   Review of Systems Review of Systems  Genitourinary: Positive for frequency.   Musculoskeletal: Positive for back pain.  All other systems reviewed and are negative.    Physical Exam Updated Vital Signs BP (!) 167/76 (BP Location: Left Arm)   Pulse (!) 52   Resp 16   SpO2 100%   Physical Exam  Constitutional: She is oriented to person, place, and time. She appears well-developed and well-nourished.  HENT:  Head: Normocephalic and atraumatic.  Mouth/Throat: Oropharynx is clear and moist.  Eyes: Pupils are equal, round, and reactive to light. Conjunctivae and EOM are normal.  Neck: Normal range of motion.  Cardiovascular: Normal rate, regular rhythm and normal heart sounds.  Pulmonary/Chest: Effort normal and breath sounds normal. No stridor. No respiratory distress.  Abdominal: Soft. Bowel sounds are normal. There is no tenderness. There is no rebound.  Genitourinary:  Genitourinary Comments: Exam chaperoned by RN Normal female external genitalia without visible lesions or rash; moderate amount of thick, white vaginal discharge; no adnexal or CMT  Musculoskeletal: Normal range of motion.  Neurological: She is alert and oriented to person, place, and time.  Skin: Skin is warm and dry.  Psychiatric: She has a normal mood and affect.  Nursing note and vitals reviewed.    ED Treatments / Results  Labs (all labs ordered are listed, but only abnormal results are displayed) Labs Reviewed  WET PREP, GENITAL - Abnormal; Notable for the following components:      Result Value   Clue Cells Wet Prep HPF POC PRESENT (*)    All other components within normal limits  URINALYSIS, ROUTINE W REFLEX MICROSCOPIC - Abnormal; Notable for the following components:   APPearance HAZY (*)    Hgb urine dipstick SMALL (*)    Bacteria, UA RARE (*)    All other components within normal limits  POC URINE PREG, ED  GC/CHLAMYDIA PROBE AMP ([place]) NOT AT [facility]    EKG None  Radiology No results found.  Procedures Procedures (including critical care  time)  Medications Ordered in ED Medications - No data to display   Initial Impression / Assessment and Plan / ED Course  I have reviewed the triage vital signs and the nursing notes.  Pertinent labs & imaging results that were available during my care of the patient were reviewed by me and considered in my medical decision making (see chart for details).  47 year old female here with urinary frequency for the past week as well as new vaginal discharge that began 3 days ago.  She is afebrile and nontoxic.  Abdomen is soft and  benign, no CVA tenderness.  UA without signs of infection.  Pelvic exam performed, small amount of thick, white vaginal discharge.  Wet prep with clue cells noted consistent with BV. Gc/chl pending.  Will treat with course of flagyl.  Discussed avoiding alcohol while taking this.  She can follow-up with the women's clinic if any ongoing issues.  She will return here for new or worsening symptoms.  Final Clinical Impressions(s) / ED Diagnoses   Final diagnoses:  Bacterial vaginosis    ED Discharge Orders         Ordered    metroNIDAZOLE (FLAGYL) 500 MG tablet  2 times daily     12/26/17 0125           Larene Pickett, PA-C 12/26/17 1610    Tegeler, Gwenyth Allegra, MD 12/26/17 [phone]

## 2017-12-25 NOTE — ED Triage Notes (Addendum)
Patient c/o lower back pain with urinary frequency and lower abdominal cramping x1 week. Patient also c/o white vaginal discharge x1 week. Reports recently changing detergents.

## 2017-12-26 LAB — GC/CHLAMYDIA PROBE AMP (~~LOC~~) NOT AT ARMC
CHLAMYDIA, DNA PROBE: NEGATIVE
Neisseria Gonorrhea: NEGATIVE

## 2017-12-26 LAB — WET PREP, GENITAL
SPERM: NONE SEEN
Trich, Wet Prep: NONE SEEN
WBC, Wet Prep HPF POC: NONE SEEN
Yeast Wet Prep HPF POC: NONE SEEN

## 2017-12-26 MED ORDER — METRONIDAZOLE 500 MG PO TABS: 500.0000 mg | ORAL_TABLET | Freq: Two times a day (BID) | ORAL | 0 refills | Status: DC

## 2017-12-26 NOTE — Discharge Instructions (Addendum)
Lab results today showed BV-- this is not sexually transmitted.  The rest of your results should come back tomorrow and we will call you if any abnormalities. Take the prescribed medication as directed.  Do not drink alcohol while taking this medication, it will make you sick. Follow-up with the women's clinic if any ongoing issues. Return to the ED for new or worsening symptoms.

## 2018-05-20 ENCOUNTER — Emergency Department (HOSPITAL_COMMUNITY): Payer: BLUE CROSS/BLUE SHIELD

## 2018-05-20 ENCOUNTER — Emergency Department (HOSPITAL_COMMUNITY)
Admission: EM | Admit: 2018-05-20 | Discharge: 2018-05-20 | Disposition: A | Discharge status: Home / Self Care | Payer: BLUE CROSS/BLUE SHIELD | Attending: Emergency Medicine | Admitting: Emergency Medicine

## 2018-05-20 ENCOUNTER — Other Ambulatory Visit: Payer: Self-pay

## 2018-05-20 ENCOUNTER — Encounter (HOSPITAL_COMMUNITY): Payer: Self-pay

## 2018-05-20 DIAGNOSIS — I10 Essential (primary) hypertension: Secondary | ICD-10-CM | POA: Diagnosis not present

## 2018-05-20 DIAGNOSIS — M545 Low back pain, unspecified: Secondary | ICD-10-CM

## 2018-05-20 DIAGNOSIS — F329 Major depressive disorder, single episode, unspecified: Secondary | ICD-10-CM | POA: Diagnosis not present

## 2018-05-20 DIAGNOSIS — M542 Cervicalgia: Secondary | ICD-10-CM

## 2018-05-20 DIAGNOSIS — R51 Headache: Secondary | ICD-10-CM | POA: Diagnosis present

## 2018-05-20 LAB — I-STAT BETA HCG BLOOD, ED (MC, WL, AP ONLY): I-stat hCG, quantitative: <5 m[IU]/mL (ref <5)

## 2018-05-20 MED ORDER — NAPROXEN 500 MG PO TABS: 500.0000 mg | ORAL_TABLET | Freq: Two times a day (BID) | ORAL | 0 refills | Status: AC

## 2018-05-20 MED ORDER — KETOROLAC TROMETHAMINE 30 MG/ML IJ SOLN
30.0000 mg | Freq: Once | INTRAMUSCULAR | Status: AC
  Administered 2018-05-20: 30 mg via INTRAMUSCULAR
  Filled 2018-05-20: qty 1

## 2018-05-20 MED ORDER — METHOCARBAMOL 500 MG PO TABS: 500.0000 mg | ORAL_TABLET | Freq: Two times a day (BID) | ORAL | 0 refills | Status: AC
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
## 2018-05-20 NOTE — ED Notes (Signed)
Pt c/o neck/lower back pain. Pt given cervical collar
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
## 2022-03-22 DIAGNOSIS — Z23 Encounter for immunization: Secondary | ICD-10-CM | POA: Diagnosis not present

## 2022-05-08 ENCOUNTER — Emergency Department (HOSPITAL_BASED_OUTPATIENT_CLINIC_OR_DEPARTMENT_OTHER): Payer: 59

## 2022-05-08 ENCOUNTER — Emergency Department (HOSPITAL_BASED_OUTPATIENT_CLINIC_OR_DEPARTMENT_OTHER)
Admission: EM | Admit: 2022-05-08 | Discharge: 2022-05-08 | Disposition: A | Discharge status: Home / Self Care | Payer: 59 | Attending: Emergency Medicine | Admitting: Emergency Medicine

## 2022-05-08 ENCOUNTER — Other Ambulatory Visit: Payer: Self-pay

## 2022-05-08 ENCOUNTER — Encounter (HOSPITAL_BASED_OUTPATIENT_CLINIC_OR_DEPARTMENT_OTHER): Payer: Self-pay | Admitting: Emergency Medicine

## 2022-05-08 ENCOUNTER — Ambulatory Visit: Payer: Self-pay | Admitting: *Deleted

## 2022-05-08 DIAGNOSIS — R519 Headache, unspecified: Secondary | ICD-10-CM | POA: Diagnosis present

## 2022-05-08 DIAGNOSIS — I10 Essential (primary) hypertension: Secondary | ICD-10-CM | POA: Insufficient documentation

## 2022-05-08 DIAGNOSIS — I159 Secondary hypertension, unspecified: Secondary | ICD-10-CM | POA: Insufficient documentation

## 2022-05-08 DIAGNOSIS — Z79899 Other long term (current) drug therapy: Secondary | ICD-10-CM | POA: Insufficient documentation

## 2022-05-08 DIAGNOSIS — N3000 Acute cystitis without hematuria: Secondary | ICD-10-CM | POA: Diagnosis not present

## 2022-05-08 DIAGNOSIS — Z6828 Body mass index (BMI) 28.0-28.9, adult: Secondary | ICD-10-CM | POA: Diagnosis not present

## 2022-05-08 DIAGNOSIS — R030 Elevated blood-pressure reading, without diagnosis of hypertension: Secondary | ICD-10-CM | POA: Diagnosis not present

## 2022-05-08 DIAGNOSIS — T3695XA Adverse effect of unspecified systemic antibiotic, initial encounter: Secondary | ICD-10-CM | POA: Diagnosis not present

## 2022-05-08 DIAGNOSIS — B379 Candidiasis, unspecified: Secondary | ICD-10-CM | POA: Diagnosis not present

## 2022-05-08 MED ORDER — AMLODIPINE BESYLATE 5 MG PO TABS: 5.0000 mg | ORAL_TABLET | Freq: Every day | ORAL | 0 refills | Status: AC

## 2022-05-08 MED ORDER — AMLODIPINE BESYLATE 5 MG PO TABS: 5.0000 mg | ORAL_TABLET | Freq: Every day | ORAL | 0 refills | Status: DC

## 2022-05-08 MED ORDER — KETOROLAC TROMETHAMINE 60 MG/2ML IM SOLN
60.0000 mg | Freq: Once | INTRAMUSCULAR | Status: AC
  Administered 2022-05-08: 60 mg via INTRAMUSCULAR
  Filled 2022-05-08: qty 2

## 2022-05-08 MED ORDER — AMLODIPINE BESYLATE 5 MG PO TABS
5.0000 mg | ORAL_TABLET | Freq: Once | ORAL | Status: AC
  Administered 2022-05-08: 5 mg via ORAL
  Filled 2022-05-08: qty 1

## 2022-05-08 NOTE — ED Notes (Signed)
Pt discharged to home. MD notified of increased blood pressure and cleared discharge, NAD at time of discharge

## 2022-05-08 NOTE — ED Triage Notes (Signed)
[facility] and sent here due to HTN. BP 171/100 at clinic. Family hx of HTN. Pt endorses intermittent blurred vision and HA for 2 weeks.

## 2022-05-08 NOTE — Telephone Encounter (Signed)
  Chief Complaint: Hypertension Symptoms: BP 170/100, blurred vision, headache Frequency: x1 week Pertinent Negatives: Patient denies  Disposition: [x] ED /[] Urgent Care (no appt availability in office) / [] Appointment(In office/virtual)/ []  [facility]/ [] Home Care/ [] Refused Recommended Disposition /[]  Mobile Bus/ []  Follow-up with PCP Additional Notes: Pt had BP checked just prior to call. States [facility] told her to get to an ED. "Could be organ failure." Pt triaged, advised ED. States willl go to [facility]. No PCP. Reason for Disposition  [id number] Systolic BP  >= 0000000 OR Diastolic >= [id number] AND [id number] cardiac (e.g., breathing difficulty, chest pain) or neurologic symptoms (e.g., new-onset blurred or double vision, unsteady gait)  Answer Assessment - Initial Assessment Questions 1. BLOOD PRESSURE: "What is the blood pressure?" "Did you take at least two measurements 5 minutes apart?"     170/100 2. ONSET: "When did you take your blood pressure?"     [facility] today 3. HOW: "How did you take your blood pressure?" (e.g., automatic home BP monitor, visiting nurse)     [facility] 4. HISTORY: "Do you have a history of high blood pressure?"     Yes, years ago 5. MEDICINES: "Are you taking any medicines for blood pressure?" "Have you missed any doses recently?"     No 6. OTHER SYMPTOMS: "Do you have any symptoms?" (e.g., blurred vision, chest pain, difficulty breathing, headache, weakness)     Blurred vision, Headache for 1 week.  Protocols used: Blood Pressure - High-A-AH

## 2022-05-08 NOTE — ED Provider Notes (Signed)
[facility] Provider Note   CSN: [id number] Arrival date & time: 05/08/22  1356     History  Chief Complaint  Patient presents with   Hypertension    Judy Rogers is a 52 y.o. female.  Patient sent here for evaluation of high blood pressure.  Blood pressure was [id number] systolic at clinic.  She is fairly asymptomatic but has had some mild headaches on and off here recently.  She does have history of migraines.  She has a very mild headache now.  She denies any weakness or numbness or vision changes.  Sometimes she will get blurred vision with her migraines.  She denies any chest pain shortness of breath or leg swelling.  Overall she feels well.  Sent here due to concern for blood pressure.  The history is provided by the patient.       Home Medications Prior to Admission medications   Medication Sig Start Date End Date Taking? Authorizing Provider  amLODipine (NORVASC) 5 MG tablet Take 1 tablet (5 mg total) by mouth daily. 05/08/22 06/07/22 Yes Azaela Caracci, DO  acetaminophen (TYLENOL) 500 MG tablet Take 1,000 mg by mouth daily as needed for moderate pain.    [provider]  lisinopril-hydrochlorothiazide (PRINZIDE,ZESTORETIC) 20-25 MG tablet Take 1 tablet by mouth daily. Patient not taking: Reported on 12/25/2017 07/19/16   Shawnee Knapp, MD  methocarbamol (ROBAXIN) 500 MG tablet Take 1 tablet (500 mg total) by mouth 2 (two) times daily. 05/20/18   Darlin Drop P, PA-C  metroNIDAZOLE (FLAGYL) 500 MG tablet Take 1 tablet (500 mg total) by mouth 2 (two) times daily. 12/26/17   Larene Pickett, PA-C  naproxen (NAPROSYN) 500 MG tablet Take 1 tablet (500 mg total) by mouth 2 (two) times daily. 05/20/18   Darlin Drop P, PA-C  phentermine (ADIPEX-P) 37.5 MG tablet Take 1 tablet (37.5 mg total) by mouth daily before breakfast. Patient not taking: Reported on 12/25/2017 07/19/16   Shawnee Knapp, MD  prenatal vitamin w/FE, FA (PRENATAL 1 + 1) 27-1  MG TABS tablet Take 1 tablet by mouth daily. Patient not taking: Reported on 12/25/2017 07/19/16   Shawnee Knapp, MD  topiramate (TOPAMAX) 50 MG tablet Take 1 tablet (50 mg total) by mouth 2 (two) times daily. Patient not taking: Reported on 12/25/2017 07/19/16   Shawnee Knapp, MD  topiramate (TOPAMAX) 50 MG tablet TAKE 1 TABLET(50 MG) BY MOUTH TWICE DAILY Patient not taking: TAKE 1 TABLET(50 MG) BY MOUTH TWICE DAILY 08/17/16   Shawnee Knapp, MD  Vitamin D, Ergocalciferol, (DRISDOL) 50000 units CAPS capsule Take 1 capsule (50,000 Units total) by mouth every 7 (seven) days. Patient not taking: Reported on 12/25/2017 05/09/16   Salvadore Dom, MD      Allergies    Patient has no known allergies.    Review of Systems   Review of Systems  Physical Exam Updated Vital Signs BP (!) 176/83   Pulse (!) 57   Temp 98.3 F (36.8 C) (Oral)   Resp 16   Ht 5\' 5"  (1.651 m)   Wt 88 kg   SpO2 100%   BMI 32.28 kg/m  Physical Exam Vitals and nursing note reviewed.  Constitutional:      General: She is not in acute distress.    Appearance: Normal appearance. She is well-developed. She is not ill-appearing.  HENT:     Head: Normocephalic and atraumatic.     Nose: Nose normal.  Mouth/Throat:     Mouth: Mucous membranes are moist.  Eyes:     Extraocular Movements: Extraocular movements intact.     Conjunctiva/sclera: Conjunctivae normal.     Pupils: Pupils are equal, round, and reactive to light.  Cardiovascular:     Rate and Rhythm: Normal rate and regular rhythm.     Pulses: Normal pulses.     Heart sounds: Normal heart sounds. No murmur heard. Pulmonary:     Effort: Pulmonary effort is normal. No respiratory distress.     Breath sounds: Normal breath sounds.  Abdominal:     Palpations: Abdomen is soft.     Tenderness: There is no abdominal tenderness.  Musculoskeletal:        General: No swelling.     Cervical back: Normal range of motion and neck supple.  Skin:    General: Skin is  warm and dry.     Capillary Refill: Capillary refill takes less than 2 seconds.  Neurological:     General: No focal deficit present.     Mental Status: She is alert and oriented to person, place, and time.     Cranial Nerves: No cranial nerve deficit.     Sensory: No sensory deficit.     Motor: No weakness.     Coordination: Coordination normal.     Comments: 5+ out of 5 strength throughout, normal sensation, no drift, normal finger-nose-finger, normal speech  Psychiatric:        Mood and Affect: Mood normal.     ED Results / Procedures / Treatments   Labs (all labs ordered are listed, but only abnormal results are displayed) Labs Reviewed - No data to display  EKG None  Radiology CT Head Wo Contrast  Result Date: 05/08/2022 CLINICAL DATA:  Headaches EXAM: CT HEAD WITHOUT CONTRAST TECHNIQUE: Contiguous axial images were obtained from the base of the skull through the vertex without intravenous contrast. RADIATION DOSE REDUCTION: This exam was performed according to the departmental dose-optimization program which includes automated exposure control, adjustment of the mA and/or kV according to patient size and/or use of iterative reconstruction technique. COMPARISON:  05/20/2018 FINDINGS: Brain: No acute intracranial findings are seen. There are no signs of bleeding within the cranium. Ventricles are not dilated. There is no focal edema or mass effect. Vascular: Unremarkable. Skull: No acute findings are seen. Sinuses/Orbits: Unremarkable. Other: Increased amount of CSF in the sella may suggest partial empty sella. No interval changes are noted. IMPRESSION: No acute intracranial findings are seen in noncontrast CT brain. Electronically Signed   By: Elmer Picker M.D.   On: 05/08/2022 14:44    Procedures Procedures    Medications Ordered in ED Medications  ketorolac (TORADOL) injection 60 mg (has no administration in time range)  amLODipine (NORVASC) tablet 5 mg (has no  administration in time range)    ED Course/ Medical Decision Making/ A&P                             Medical Decision Making Amount and/or Complexity of Data Reviewed Radiology: ordered.  Risk Prescription drug management.   [name] here with high blood pressure.  176/83.  Neurologically she is intact.  She has a history of migraines.  No history of high blood pressure.  Sent here by clinic due to high blood pressure.  She has been having some mild headaches on and off for the last few days.  No major headache now.  Has responded to Tylenol.  Overall she appears well.  She has no chest pain.  Conservatively will get a head CT to rule out head bleed but I suspect she has intrinsically high blood pressure.  This could be slightly worse due to recent migraines however will get head CT and if unremarkable start her amlodipine.  She has no symptoms to suggest stroke otherwise.  No chest pain and overall she is asymptomatic.  Head CT per radiology report with no acute findings.  Patient given Toradol shot for headache and will start her on amlodipine and have her follow-up with primary care doctor.  Discharged in good condition.  This chart was dictated using voice recognition software.  Despite best efforts to proofread,  errors can occur which can change the documentation meaning.         Final Clinical Impression(s) / ED Diagnoses Final diagnoses:  Secondary hypertension    Rx / DC Orders ED Discharge Orders          Ordered    amLODipine (NORVASC) 5 MG tablet  Daily        05/08/22 1502              Lennice Sites, DO 05/08/22 1502

## 2022-08-16 DIAGNOSIS — I10 Essential (primary) hypertension: Secondary | ICD-10-CM | POA: Diagnosis not present

## 2022-08-16 DIAGNOSIS — Z760 Encounter for issue of repeat prescription: Secondary | ICD-10-CM | POA: Diagnosis not present

## 2022-08-16 DIAGNOSIS — Z681 Body mass index (BMI) 19 or less, adult: Secondary | ICD-10-CM | POA: Diagnosis not present

## 2022-08-16 DIAGNOSIS — K5900 Constipation, unspecified: Secondary | ICD-10-CM | POA: Diagnosis not present

## 2022-08-16 DIAGNOSIS — R3915 Urgency of urination: Secondary | ICD-10-CM | POA: Diagnosis not present

## 2022-08-16 DIAGNOSIS — B3731 Acute candidiasis of vulva and vagina: Secondary | ICD-10-CM | POA: Diagnosis not present

## 2022-08-16 DIAGNOSIS — R300 Dysuria: Secondary | ICD-10-CM | POA: Diagnosis not present

## 2022-10-23 ENCOUNTER — Other Ambulatory Visit: Payer: Self-pay

## 2022-10-23 ENCOUNTER — Emergency Department (HOSPITAL_BASED_OUTPATIENT_CLINIC_OR_DEPARTMENT_OTHER): Payer: 59 | Admitting: Radiology

## 2022-10-23 ENCOUNTER — Emergency Department (HOSPITAL_BASED_OUTPATIENT_CLINIC_OR_DEPARTMENT_OTHER)
Admission: EM | Admit: 2022-10-23 | Discharge: 2022-10-23 | Disposition: A | Discharge status: Home / Self Care | Payer: 59 | Attending: Emergency Medicine | Admitting: Emergency Medicine

## 2022-10-23 DIAGNOSIS — Z79899 Other long term (current) drug therapy: Secondary | ICD-10-CM | POA: Diagnosis not present

## 2022-10-23 DIAGNOSIS — S0083XA Contusion of other part of head, initial encounter: Secondary | ICD-10-CM | POA: Insufficient documentation

## 2022-10-23 DIAGNOSIS — S0993XA Unspecified injury of face, initial encounter: Secondary | ICD-10-CM | POA: Diagnosis present

## 2022-10-23 DIAGNOSIS — M25511 Pain in right shoulder: Secondary | ICD-10-CM

## 2022-10-23 DIAGNOSIS — S40011A Contusion of right shoulder, initial encounter: Secondary | ICD-10-CM | POA: Diagnosis not present

## 2022-10-23 DIAGNOSIS — S4991XA Unspecified injury of right shoulder and upper arm, initial encounter: Secondary | ICD-10-CM | POA: Diagnosis not present

## 2022-10-23 LAB — PREGNANCY, URINE: Preg Test, Ur: NEGATIVE

## 2022-10-23 MED ORDER — IBUPROFEN 600 MG PO TABS: 600.0000 mg | ORAL_TABLET | Freq: Four times a day (QID) | ORAL | 0 refills | Status: AC | PRN

## 2022-10-23 MED ORDER — IBUPROFEN 800 MG PO TABS
800.0000 mg | ORAL_TABLET | Freq: Once | ORAL | Status: AC
  Administered 2022-10-23: 800 mg via ORAL
  Filled 2022-10-23: qty 1
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
## 2023-03-11 ENCOUNTER — Encounter (HOSPITAL_BASED_OUTPATIENT_CLINIC_OR_DEPARTMENT_OTHER): Payer: Self-pay | Admitting: Emergency Medicine

## 2023-03-11 ENCOUNTER — Emergency Department (HOSPITAL_BASED_OUTPATIENT_CLINIC_OR_DEPARTMENT_OTHER)
Admission: EM | Admit: 2023-03-11 | Discharge: 2023-03-11 | Disposition: A | Discharge status: Home / Self Care | Payer: 59 | Attending: Emergency Medicine | Admitting: Emergency Medicine

## 2023-03-11 DIAGNOSIS — K0889 Other specified disorders of teeth and supporting structures: Secondary | ICD-10-CM

## 2023-03-11 DIAGNOSIS — K0381 Cracked tooth: Secondary | ICD-10-CM | POA: Diagnosis not present

## 2023-03-11 DIAGNOSIS — K029 Dental caries, unspecified: Secondary | ICD-10-CM | POA: Insufficient documentation

## 2023-03-11 MED ORDER — HYDROCODONE-ACETAMINOPHEN 5-325 MG PO TABS: 1.0000 | ORAL_TABLET | Freq: Four times a day (QID) | ORAL | 0 refills | Status: AC | PRN

## 2023-03-11 MED ORDER — AMOXICILLIN-POT CLAVULANATE 875-125 MG PO TABS
1.0000 | ORAL_TABLET | Freq: Once | ORAL | Status: AC
  Administered 2023-03-11: 1 via ORAL
  Filled 2023-03-11: qty 1

## 2023-03-11 MED ORDER — AMOXICILLIN-POT CLAVULANATE 875-125 MG PO TABS: 1.0000 | ORAL_TABLET | Freq: Two times a day (BID) | ORAL | 0 refills | Status: AC

## 2023-03-11 MED ORDER — HYDROCODONE-ACETAMINOPHEN 5-325 MG PO TABS
1.0000 | ORAL_TABLET | Freq: Once | ORAL | Status: AC
  Administered 2023-03-11: 1 via ORAL
  Filled 2023-03-11: qty 1

## 2023-03-11 NOTE — ED Provider Notes (Signed)
[place] EMERGENCY DEPARTMENT AT [facility]  Provider Note  CSN: [id number] Arrival date & time: 03/11/23 4098  History Chief Complaint  Patient presents with   Dental Pain    Judy Rogers is a 53 y.o. female here for toothache. She states she has been having trouble with her L lower 3rd molar off and on for the last few months, went to the dentist yesterday and during their exam she reports her L 2nd molar got chipped. She was referred to Oral Surgery but not given any medications. She reports pain has worsened during the night and is not relieved with OTC meds.    Home Medications Prior to Admission medications   Medication Sig Start Date End Date Taking? Authorizing Provider  amoxicillin-clavulanate (AUGMENTIN) 875-125 MG tablet Take 1 tablet by mouth every 12 (twelve) hours. 03/11/23  Yes Pollyann Savoy, MD  HYDROcodone-acetaminophen (NORCO/VICODIN) 5-325 MG tablet Take 1 tablet by mouth every 6 (six) hours as needed for severe pain (pain score 7-10). 03/11/23  Yes Pollyann Savoy, MD  acetaminophen (TYLENOL) 500 MG tablet Take 1,000 mg by mouth daily as needed for moderate pain.    [provider]  amLODipine (NORVASC) 5 MG tablet Take 1 tablet (5 mg total) by mouth daily. 05/08/22 06/07/22  Curatolo, Adam, DO  ibuprofen (ADVIL) 600 MG tablet Take 1 tablet (600 mg total) by mouth every 6 (six) hours as needed. 10/23/22   Horton, Mayer Masker, MD  methocarbamol (ROBAXIN) 500 MG tablet Take 1 tablet (500 mg total) by mouth 2 (two) times daily. 05/20/18   Carlyle Basques P, PA-C  naproxen (NAPROSYN) 500 MG tablet Take 1 tablet (500 mg total) by mouth 2 (two) times daily. 05/20/18   Leretha Dykes, PA-C     Allergies    Patient has no known allergies.   Review of Systems   Review of Systems Please see HPI for pertinent positives and negatives  Physical Exam BP (!) 173/81   Pulse 65   Temp 97.9 F (36.6 C)   Resp 20   LMP 02/25/2023   SpO2 100%    Physical Exam Vitals and nursing note reviewed.  HENT:     Head: Normocephalic.     Nose: Nose normal.     Mouth/Throat:     Comments: Dental caries L lower 3rd molar and a partial fracture of L lower 2nd molar. Tender to palpation, no fluctuance or signs of deep infection. Eyes:     Extraocular Movements: Extraocular movements intact.  Pulmonary:     Effort: Pulmonary effort is normal.  Musculoskeletal:        General: Normal range of motion.     Cervical back: Neck supple.  Skin:    Findings: No rash (on exposed skin).  Neurological:     Mental Status: She is alert and oriented to person, place, and time.  Psychiatric:        Mood and Affect: Mood normal.     ED Results / Procedures / Treatments   EKG None  Procedures Procedures  Medications Ordered in the ED Medications  amoxicillin-clavulanate (AUGMENTIN) 875-125 MG per tablet 1 tablet (has no administration in time range)  HYDROcodone-acetaminophen (NORCO/VICODIN) 5-325 MG per tablet 1 tablet (has no administration in time range)    Initial Impression and Plan  Patient here with toothache, will give a course of Abx for her concerns of infection. Short course of pain medication and close dental follow up for definitive care.  ED Course       MDM Rules/Calculators/A&P Medical Decision Making Problems Addressed: Toothache: acute illness or injury  Risk Prescription drug management.     Final Clinical Impression(s) / ED Diagnoses Final diagnoses:  Toothache    Rx / DC Orders ED Discharge Orders          Ordered    amoxicillin-clavulanate (AUGMENTIN) 875-125 MG tablet  Every 12 hours        03/11/23 0524    HYDROcodone-acetaminophen (NORCO/VICODIN) 5-325 MG tablet  Every 6 hours PRN        03/11/23 0524             Pollyann Savoy, MD 03/11/23 [phone]
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
